# Patient Record
Sex: Male | Born: 1955 | Race: White | Hispanic: No | Smoking: Current every day smoker
Health system: Southern US, Community
[De-identification: ages and names within clinical notes are randomized; demographics above are authoritative.]

## PROBLEM LIST (undated history)

## (undated) DIAGNOSIS — I2510 Atherosclerotic heart disease of native coronary artery without angina pectoris: Secondary | ICD-10-CM

## (undated) DIAGNOSIS — K219 Gastro-esophageal reflux disease without esophagitis: Secondary | ICD-10-CM

## (undated) DIAGNOSIS — I10 Essential (primary) hypertension: Secondary | ICD-10-CM

## (undated) DIAGNOSIS — Z720 Tobacco use: Secondary | ICD-10-CM

## (undated) DIAGNOSIS — E785 Hyperlipidemia, unspecified: Secondary | ICD-10-CM

## (undated) DIAGNOSIS — J449 Chronic obstructive pulmonary disease, unspecified: Secondary | ICD-10-CM

## (undated) HISTORY — PX: APPENDECTOMY: SHX54

---

## 2009-12-06 ENCOUNTER — Emergency Department (HOSPITAL_COMMUNITY): Admission: EM | Admit: 2009-12-06 | Discharge: 2009-12-06 | Payer: Self-pay | Admitting: Emergency Medicine

## 2010-07-03 ENCOUNTER — Emergency Department (HOSPITAL_COMMUNITY): Admission: EM | Admit: 2010-07-03 | Discharge: 2010-07-03 | Payer: Self-pay | Admitting: Emergency Medicine

## 2010-10-05 ENCOUNTER — Emergency Department (HOSPITAL_COMMUNITY): Admission: EM | Admit: 2010-10-05 | Discharge: 2010-10-05 | Payer: Self-pay | Admitting: Emergency Medicine

## 2011-03-08 LAB — URINALYSIS, ROUTINE W REFLEX MICROSCOPIC
Bilirubin Urine: NEGATIVE
Glucose, UA: NEGATIVE mg/dL
Hgb urine dipstick: NEGATIVE
Ketones, ur: NEGATIVE mg/dL
Nitrite: NEGATIVE
Protein, ur: NEGATIVE mg/dL
Specific Gravity, Urine: >1.03 — ABNORMAL HIGH (ref 1.005–1.030)
Urobilinogen, UA: 0.2 mg/dL (ref 0.0–1.0)
pH: 5.5 (ref 5.0–8.0)

## 2011-03-08 LAB — GLUCOSE, CAPILLARY: Glucose-Capillary: 120 mg/dL — ABNORMAL HIGH (ref 70–99)

## 2013-04-28 ENCOUNTER — Emergency Department (HOSPITAL_COMMUNITY): Payer: Self-pay

## 2013-04-28 ENCOUNTER — Inpatient Hospital Stay (HOSPITAL_COMMUNITY)
Admission: EM | Admit: 2013-04-28 | Discharge: 2013-05-10 | DRG: 234 | Disposition: A | Discharge status: Home / Self Care | Payer: MEDICAID | Attending: Cardiothoracic Surgery | Admitting: Cardiothoracic Surgery

## 2013-04-28 ENCOUNTER — Encounter (HOSPITAL_COMMUNITY): Payer: Self-pay | Admitting: *Deleted

## 2013-04-28 DIAGNOSIS — J95811 Postprocedural pneumothorax: Secondary | ICD-10-CM | POA: Diagnosis present

## 2013-04-28 DIAGNOSIS — Y832 Surgical operation with anastomosis, bypass or graft as the cause of abnormal reaction of the patient, or of later complication, without mention of misadventure at the time of the procedure: Secondary | ICD-10-CM | POA: Diagnosis present

## 2013-04-28 DIAGNOSIS — I658 Occlusion and stenosis of other precerebral arteries: Secondary | ICD-10-CM | POA: Diagnosis present

## 2013-04-28 DIAGNOSIS — F17200 Nicotine dependence, unspecified, uncomplicated: Secondary | ICD-10-CM | POA: Diagnosis present

## 2013-04-28 DIAGNOSIS — E8770 Fluid overload, unspecified: Secondary | ICD-10-CM

## 2013-04-28 DIAGNOSIS — Z7982 Long term (current) use of aspirin: Secondary | ICD-10-CM

## 2013-04-28 DIAGNOSIS — I6529 Occlusion and stenosis of unspecified carotid artery: Secondary | ICD-10-CM | POA: Diagnosis present

## 2013-04-28 DIAGNOSIS — E8779 Other fluid overload: Secondary | ICD-10-CM | POA: Diagnosis not present

## 2013-04-28 DIAGNOSIS — I10 Essential (primary) hypertension: Secondary | ICD-10-CM | POA: Diagnosis present

## 2013-04-28 DIAGNOSIS — T8182XA Emphysema (subcutaneous) resulting from a procedure, initial encounter: Secondary | ICD-10-CM | POA: Diagnosis not present

## 2013-04-28 DIAGNOSIS — K219 Gastro-esophageal reflux disease without esophagitis: Secondary | ICD-10-CM | POA: Diagnosis present

## 2013-04-28 DIAGNOSIS — R079 Chest pain, unspecified: Secondary | ICD-10-CM

## 2013-04-28 DIAGNOSIS — I739 Peripheral vascular disease, unspecified: Secondary | ICD-10-CM | POA: Diagnosis present

## 2013-04-28 DIAGNOSIS — J449 Chronic obstructive pulmonary disease, unspecified: Secondary | ICD-10-CM | POA: Diagnosis present

## 2013-04-28 DIAGNOSIS — E781 Pure hyperglyceridemia: Secondary | ICD-10-CM | POA: Diagnosis present

## 2013-04-28 DIAGNOSIS — Z79899 Other long term (current) drug therapy: Secondary | ICD-10-CM

## 2013-04-28 DIAGNOSIS — D696 Thrombocytopenia, unspecified: Secondary | ICD-10-CM | POA: Diagnosis not present

## 2013-04-28 DIAGNOSIS — F17210 Nicotine dependence, cigarettes, uncomplicated: Secondary | ICD-10-CM | POA: Diagnosis present

## 2013-04-28 DIAGNOSIS — R0609 Other forms of dyspnea: Secondary | ICD-10-CM | POA: Diagnosis present

## 2013-04-28 DIAGNOSIS — J4489 Other specified chronic obstructive pulmonary disease: Secondary | ICD-10-CM | POA: Diagnosis present

## 2013-04-28 DIAGNOSIS — R0989 Other specified symptoms and signs involving the circulatory and respiratory systems: Secondary | ICD-10-CM | POA: Diagnosis present

## 2013-04-28 DIAGNOSIS — E785 Hyperlipidemia, unspecified: Secondary | ICD-10-CM | POA: Diagnosis present

## 2013-04-28 DIAGNOSIS — R209 Unspecified disturbances of skin sensation: Secondary | ICD-10-CM | POA: Diagnosis present

## 2013-04-28 DIAGNOSIS — I2510 Atherosclerotic heart disease of native coronary artery without angina pectoris: Principal | ICD-10-CM | POA: Diagnosis present

## 2013-04-28 DIAGNOSIS — I200 Unstable angina: Secondary | ICD-10-CM | POA: Diagnosis present

## 2013-04-28 DIAGNOSIS — D62 Acute posthemorrhagic anemia: Secondary | ICD-10-CM | POA: Diagnosis not present

## 2013-04-28 DIAGNOSIS — Z951 Presence of aortocoronary bypass graft: Secondary | ICD-10-CM

## 2013-04-28 HISTORY — DX: Gastro-esophageal reflux disease without esophagitis: K21.9

## 2013-04-28 HISTORY — DX: Tobacco use: Z72.0

## 2013-04-28 LAB — CBC
HCT: 43.1 % (ref 39.0–52.0)
MCH: 31.6 pg (ref 26.0–34.0)
MCV: 87.2 fL (ref 78.0–100.0)
RDW: 12.8 % (ref 11.5–15.5)
WBC: 11.8 10*3/uL — ABNORMAL HIGH (ref 4.0–10.5)

## 2013-04-28 LAB — HEPATIC FUNCTION PANEL
ALT: 12 U/L (ref 0–53)
Albumin: 3.6 g/dL (ref 3.5–5.2)
Alkaline Phosphatase: 97 U/L (ref 39–117)
Total Bilirubin: 0.2 mg/dL — ABNORMAL LOW (ref 0.3–1.2)
Total Protein: 6.8 g/dL (ref 6.0–8.3)

## 2013-04-28 LAB — TROPONIN I: Troponin I: <0.3 ng/mL (ref <0.30)

## 2013-04-28 LAB — COMPREHENSIVE METABOLIC PANEL
Albumin: 4.2 g/dL (ref 3.5–5.2)
BUN: 25 mg/dL — ABNORMAL HIGH (ref 6–23)
Calcium: 9.4 mg/dL (ref 8.4–10.5)
Chloride: 103 mEq/L (ref 96–112)
Creatinine, Ser: 1.1 mg/dL (ref 0.50–1.35)
GFR calc non Af Amer: 73 mL/min — ABNORMAL LOW (ref >90)
Total Bilirubin: 0.3 mg/dL (ref 0.3–1.2)

## 2013-04-28 MED ORDER — METOPROLOL TARTRATE 25 MG PO TABS
25.0000 mg | ORAL_TABLET | Freq: Two times a day (BID) | ORAL | Status: DC
  Administered 2013-04-28 – 2013-05-03 (×11): 25 mg via ORAL
  Filled 2013-04-28 (×13): qty 1

## 2013-04-28 MED ORDER — ONDANSETRON HCL 4 MG PO TABS: 4.0000 mg | ORAL_TABLET | Freq: Four times a day (QID) | ORAL | Status: DC | PRN

## 2013-04-28 MED ORDER — SODIUM CHLORIDE 0.9 % IJ SOLN
3.0000 mL | Freq: Two times a day (BID) | INTRAMUSCULAR | Status: DC
  Administered 2013-04-28 – 2013-05-01 (×4): 3 mL via INTRAVENOUS
  Filled 2013-04-28: qty 3

## 2013-04-28 MED ORDER — ONDANSETRON HCL 4 MG/2ML IJ SOLN
4.0000 mg | Freq: Four times a day (QID) | INTRAMUSCULAR | Status: DC | PRN
  Administered 2013-05-03: 4 mg via INTRAVENOUS
  Filled 2013-04-28: qty 2

## 2013-04-28 MED ORDER — ASPIRIN EC 81 MG PO TBEC
81.0000 mg | DELAYED_RELEASE_TABLET | Freq: Every day | ORAL | Status: DC
  Administered 2013-04-29 – 2013-05-03 (×4): 81 mg via ORAL
  Filled 2013-04-28 (×6): qty 1

## 2013-04-28 MED ORDER — NICOTINE 21 MG/24HR TD PT24
21.0000 mg | MEDICATED_PATCH | Freq: Every day | TRANSDERMAL | Status: DC
  Administered 2013-04-28 – 2013-05-09 (×9): 21 mg via TRANSDERMAL
  Filled 2013-04-28 (×13): qty 1

## 2013-04-28 MED ORDER — ONDANSETRON HCL 4 MG/2ML IJ SOLN: 4.0000 mg | INTRAMUSCULAR | Status: DC | PRN

## 2013-04-28 MED ORDER — ENOXAPARIN SODIUM 40 MG/0.4ML ~~LOC~~ SOLN
40.0000 mg | SUBCUTANEOUS | Status: DC
  Administered 2013-04-28: 40 mg via SUBCUTANEOUS
  Filled 2013-04-28: qty 0.4

## 2013-04-28 MED ORDER — NITROGLYCERIN 0.4 MG SL SUBL
SUBLINGUAL_TABLET | SUBLINGUAL | Status: AC
  Administered 2013-04-28: 0.4 mg via SUBLINGUAL
  Filled 2013-04-28: qty 25

## 2013-04-28 MED ORDER — NITROGLYCERIN 0.4 MG SL SUBL
0.4000 mg | SUBLINGUAL_TABLET | SUBLINGUAL | Status: DC | PRN
  Administered 2013-04-28 (×2): 0.4 mg via SUBLINGUAL

## 2013-04-28 MED ORDER — TRAZODONE HCL 50 MG PO TABS
50.0000 mg | ORAL_TABLET | Freq: Every evening | ORAL | Status: DC | PRN
  Filled 2013-04-28: qty 1

## 2013-04-28 MED ORDER — ACETAMINOPHEN 325 MG PO TABS
650.0000 mg | ORAL_TABLET | ORAL | Status: DC | PRN
  Administered 2013-04-29: 650 mg via ORAL
  Filled 2013-04-28: qty 2

## 2013-04-28 MED ORDER — PANTOPRAZOLE SODIUM 40 MG PO TBEC
40.0000 mg | DELAYED_RELEASE_TABLET | Freq: Every day | ORAL | Status: DC
  Administered 2013-04-29 – 2013-05-03 (×5): 40 mg via ORAL
  Filled 2013-04-28 (×5): qty 1

## 2013-04-28 NOTE — ED Provider Notes (Signed)
History  This chart was scribed for Geoffery Lyons, MD by Shari Heritage, ED Scribe. The patient was seen in room APA11/APA11. Patient's care was started at 1739.   CSN: [id number]  Arrival date & time 04/28/13  1716   First MD Initiated Contact with Patient 04/28/13 1739      Chief Complaint  Patient presents with  . Chest Pain    The history is provided by the patient. No language interpreter was used.    HPI Comments: Mark Carpenter is a 57 y.o. male who presents to the Emergency Department complaining of intermittent, heavy, central chest pain onset 2 weeks ago. Patient states that pain often comes on with exertion. The pain radiates down both arms and through to his back. There is associated tingling in the upper extremities and dyspnea that is worse with exertion. Patient has been taking aspirin with pain episodes. He states that he took 1-325 mg aspirin tablet this morning and has taken several aspirin 81 mg this afternoon. Patient denies history of cardiac disease. He has no history of cardiac catheterization or stents. He is not followed regularly by cardiology. Patient says that he was adopted so knows little of his family medical history. He smokes 2 packs of cigarettes per day.    History reviewed. No pertinent past medical history.  Past Surgical History  Procedure Laterality Date  . Appendectomy      History reviewed. No pertinent family history.  History  Substance Use Topics  . Smoking status: Current Every Day Smoker  . Smokeless tobacco: Not on file  . Alcohol Use: No      Review of Systems A complete 10 system review of systems was obtained and all systems are negative except as noted in the HPI and PMH.   Allergies  Review of patient's allergies indicates no known allergies.  Home Medications  No current outpatient prescriptions on file.  Triage Vitals: BP 173/84  Pulse 97  Temp(Src) 99.9 F (37.7 C) (Oral)  Resp 20  Ht 5\' 11"  (1.803 m)  Wt 190 lb  (86.183 kg)  BMI 26.51 kg/m2  SpO2 99%  Physical Exam  Constitutional: He is oriented to person, place, and time. He appears well-developed and well-nourished.  HENT:  Head: Normocephalic and atraumatic.  Mouth/Throat: Oropharynx is clear and moist.  Eyes: Conjunctivae and EOM are normal. Pupils are equal, round, and reactive to light.  Neck: Normal range of motion. Neck supple.  Cardiovascular: Normal rate, regular rhythm and normal heart sounds.   No murmur heard. Pulmonary/Chest: Effort normal and breath sounds normal. No respiratory distress. He exhibits no tenderness.  No chest wall tenderness.  Abdominal: Soft. Bowel sounds are normal. There is no tenderness.  Musculoskeletal: Normal range of motion.  Neurological: He is alert and oriented to person, place, and time.  Skin: Skin is warm and dry. No rash noted.  Psychiatric: He has a normal mood and affect. His behavior is normal.    ED Course  Procedures (including critical care time) DIAGNOSTIC STUDIES: Oxygen Saturation is 99% on room air, normal by my interpretation.    COORDINATION OF CARE: 5:53 PM- Patient informed of current plan for treatment and evaluation and agrees with plan at this time.     Labs Reviewed - No data to display No results found.   No diagnosis found.   Date: 04/28/2013  Rate: 95  Rhythm: normal sinus rhythm  QRS Axis: normal  Intervals: normal  ST/T Wave abnormalities: nonspecific T wave changes  Conduction Disutrbances:none  Narrative Interpretation:   Old EKG Reviewed: changes noted    MDM  Patient presents with exertional chest discomfort for the past two weeks.  His history is concerning for cardiac disease and the ekg shows borderline T wave flattening in the lateral leads.  I have spoken with Dr. Orvan Falconer who agrees to admit.        I personally performed the services described in this documentation, which was scribed in my presence. The recorded information has been  reviewed and is accurate.    Geoffery Lyons, MD 04/28/13 Ernestina [place]

## 2013-04-28 NOTE — ED Notes (Signed)
Nurse will call back for report.

## 2013-04-28 NOTE — ED Notes (Addendum)
Pt presents with chest pain intermittent x2 weeks. States pain is worse with activity and feels like "an elephant sitting on his chest" states he gets relief when he rests. Pt denies cardiac or any medical history. Upon admission states mild chest pain around 4/10. States some numbness and tingling in both arms

## 2013-04-28 NOTE — H&P (Signed)
[facility] History and Physical  Mark Carpenter  YNW:[id number]  DOB: 11-05-1956   DOA: 04/28/2013   PCP:   No primary provider on file.  Chief Complaint:  Chest pain with exertion getting worse over 2 months  HPI: Mark Carpenter is an 57 y.o. male.    Adopted Middle-aged Caucasian gentleman with no medical, smokes 2 pack of cigarettes per day, does maintenance work in [employer], reports episodic exertional chest pain for the past 2 months. Started getting worse 2 weeks ago when he noted chest pains every day, lasting 2-15 minutes, and resolving completely after about 10 minutes of rest. It feels like an elephant sitting on his chest; and it radiates through to his shoulder blades, and is associated with numbness of both arms.  Note because symptoms seemed to be getting much worse he came to the emergency room today to be evaluated. In the ER EKG was done which showed ST depression in leads V4 to V6, and hospitalist service was called to assess.  Patient denies any associated nausea vomiting dizziness or diaphoresis. Denies any bloody or black stool. In fact denies any other symptoms except occasional wheezing.  Rewiew of Systems:   Maintenance  worker in [place]  All systems negative except as marked bold or noted in the HPI;  Constitutional: malaise, fever and chills. ;  Eyes: eye pain, redness and discharge. ;  ENMT: ear pain, hoarseness, nasal congestion, sinus pressure and sore throat. ;  Cardiovascular palpitations, diaphoresis, dyspnea and peripheral edema. ;  Respiratory: cough, hemoptysis, wheezing and stridor. ;  Gastrointestinal: nausea, vomiting, diarrhea, constipation, abdominal pain, melena, blood in stool, hematemesis, jaundice and rectal bleeding. unusual weight loss..  Genitourinary: frequency, dysuria, incontinence,flank pain and hematuria;  Musculoskeletal: back pain and neck pain. swelling and trauma.;  Skin: . pruritus, rash, abrasions, bruising and skin  lesion.; ulcerations  Neuro: headache, lightheadedness and neck stiffness. weakness, altered level of consciousness , altered mental status, extremity weakness, burning feet, involuntary movement, seizure and syncope.  Psych: anxiety, depression, insomnia, tearfulness, panic attacks, hallucinations, paranoia, suicidal or homicidal ideation    History reviewed. No pertinent past medical history.  Past Surgical History  Procedure Laterality Date  . Appendectomy      Medications:  HOME MEDS: Prior to Admission medications   Medication Sig Start Date End Date Taking? Authorizing Provider  aspirin EC 325 MG tablet Take 325 mg by mouth every morning.   Yes Historical Provider, MD  diphenhydramine-acetaminophen (TYLENOL PM) 25-500 MG TABS Take 2 tablets by mouth at bedtime.   Yes Historical Provider, MD  ibuprofen (ADVIL,MOTRIN) 200 MG tablet Take 1,000 mg by mouth every 6 (six) hours as needed for pain.   Yes Historical Provider, MD  omeprazole (PRILOSEC) 20 MG capsule Take 20 mg by mouth every morning.   Yes Historical Provider, MD     Allergies:  No Known Allergies  Social History:   reports that he has been smoking.  He does not have any smokeless tobacco history on file. He reports that he does not drink alcohol or use illicit drugs.  Family History: Family History  Problem Relation Age of Onset  . Adopted: Yes     Physical Exam: Filed Vitals:   04/28/13 1809 04/28/13 1904 04/28/13 2036 04/28/13 2257  BP: 154/83 140/73 155/83 164/84  Pulse:  80 87 81  Temp:   98.2 F (36.8 C) 98.3 F (36.8 C)  TempSrc:   Oral   Resp: 18 18 20  20  Height:   5\' 11"  (1.803 m)   Weight:   86.183 kg (190 lb)   SpO2: 98% 97% 98% 97%   Blood pressure 164/84, pulse 81, temperature 98.3 F (36.8 C), temperature source Oral, resp. rate 20, height 5\' 11"  (1.803 m), weight 86.183 kg (190 lb), SpO2 97.00%.  GEN:  Pleasant pleasant middle-aged Caucasian gentleman lying bed in no acute distress;  cooperative with exam PSYCH:  alert and oriented x4;  neither anxious or depressed; affect is appropriate. HEENT: Mucous membranes pink and anicteric; PERRLA; EOM intact; no cervical lymphadenopathy nor thyromegaly or carotid bruit; no JVD; Breasts:: Not examined CHEST WALL: No tenderness CHEST: Normal respiration, clear to auscultation bilaterally HEART: Regular rate and rhythm; no murmurs rubs or gallops BACK: No kyphosis no scoliosis; no CVA tenderness ABDOMEN: Obese, soft non-tender; no masses, no organomegaly, normal abdominal bowel sounds; no pannus; no intertriginous candida. Rectal Exam: Not done EXTREMITIES:  age-appropriate arthropathy of the hands and knees; no edema; no ulcerations. Genitalia: not examined PULSES: 2+ and symmetric SKIN: Normal hydration no rash or ulceration CNS: Cranial nerves 2-12 grossly intact no focal lateralizing neurologic deficit   Labs on Admission:  Basic Metabolic Panel:  Recent Labs Lab 04/28/13 1742  NA 139  K 3.9  CL 103  CO2 25  GLUCOSE 93  BUN 25*  CREATININE 1.10  CALCIUM 9.4   Liver Function Tests:  Recent Labs Lab 04/28/13 1742  AST 17  ALT 14  ALKPHOS 113  BILITOT 0.3  PROT 7.5  ALBUMIN 4.2   No results found for this basename: LIPASE, AMYLASE,  in the last 168 hours No results found for this basename: AMMONIA,  in the last 168 hours CBC:  Recent Labs Lab 04/28/13 1742  WBC 11.8*  HGB 15.6  HCT 43.1  MCV 87.2  PLT 219   Cardiac Enzymes:  Recent Labs Lab 04/28/13 1742  TROPONINI <0.30   BNP: No components found with this basename: POCBNP,  D-dimer: No components found with this basename: D-DIMER,  CBG: No results found for this basename: GLUCAP,  in the last 168 hours  Radiological Exams on Admission: Dg Chest Portable 1 View  04/28/2013  *RADIOLOGY REPORT*  Clinical Data: Chest pain, smoker  PORTABLE CHEST - 1 VIEW  Comparison: Chest radiograph 12/06/2009  Findings: Normal mediastinum and cardiac  silhouette.  Normal pulmonary  vasculature.  No evidence of effusion, infiltrate, or pneumothorax.  No acute bony abnormality.  IMPRESSION: No acute cardiopulmonary process.   Original Report Authenticated By: Genevive Bi, M.D.     EKG: Independently reviewed. Normal sinus rhythm, occasional ectopics; ST depression in V3 to V6; no ST depression noted in limb lead 1.   Assessment/Plan Present on Admission:  . Unstable angina . Cigarette smoker   PLAN: Since chest pain seems to be more accelerated, we'll admit for observation to cycle cardiac enzymes and monitor on telemetry. Consult cardiology for stress echo or cardiac cath as in or outpatient  Other plans as per orders.  Code Status: Full code Family Communication: Plans discussed with patient Disposition Plan: Home in a day or 2 Depending on the overnight course    Seaira Byus Nocturnist [facility] Pager [phone]   04/28/2013, 11:07 PM

## 2013-04-28 NOTE — ED Notes (Signed)
Chest pain intermittent, esp on exertion. With pain down both arms and tingling, Back pain.  cough

## 2013-04-28 NOTE — ED Notes (Signed)
Pt states relief of pain

## 2013-04-28 NOTE — ED Notes (Signed)
Chest pain 3/10, states some relief after first nitro, second given.

## 2013-04-29 ENCOUNTER — Encounter (HOSPITAL_COMMUNITY): Payer: Self-pay | Admitting: Internal Medicine

## 2013-04-29 ENCOUNTER — Encounter (HOSPITAL_COMMUNITY): Payer: Self-pay | Admitting: Pharmacy Technician

## 2013-04-29 DIAGNOSIS — F17200 Nicotine dependence, unspecified, uncomplicated: Secondary | ICD-10-CM

## 2013-04-29 DIAGNOSIS — E781 Pure hyperglyceridemia: Secondary | ICD-10-CM | POA: Diagnosis present

## 2013-04-29 DIAGNOSIS — K219 Gastro-esophageal reflux disease without esophagitis: Secondary | ICD-10-CM | POA: Diagnosis present

## 2013-04-29 DIAGNOSIS — R0989 Other specified symptoms and signs involving the circulatory and respiratory systems: Secondary | ICD-10-CM

## 2013-04-29 DIAGNOSIS — I200 Unstable angina: Secondary | ICD-10-CM

## 2013-04-29 DIAGNOSIS — I10 Essential (primary) hypertension: Secondary | ICD-10-CM

## 2013-04-29 DIAGNOSIS — E785 Hyperlipidemia, unspecified: Secondary | ICD-10-CM | POA: Diagnosis present

## 2013-04-29 LAB — LIPID PANEL
Cholesterol: 192 mg/dL (ref 0–200)
HDL: 32 mg/dL — ABNORMAL LOW (ref >39)
Total CHOL/HDL Ratio: 6 RATIO

## 2013-04-29 LAB — RAPID URINE DRUG SCREEN, HOSP PERFORMED
Amphetamines: NOT DETECTED
Benzodiazepines: NOT DETECTED
Cocaine: NOT DETECTED
Opiates: NOT DETECTED
Tetrahydrocannabinol: NOT DETECTED

## 2013-04-29 LAB — CBC
HCT: 41.5 % (ref 39.0–52.0)
Hemoglobin: 14.7 g/dL (ref 13.0–17.0)
Hemoglobin: 14.5 g/dL (ref 13.0–17.0)
MCH: 31.1 pg (ref 26.0–34.0)
MCV: 87.7 fL (ref 78.0–100.0)
MCV: 84.8 fL (ref 78.0–100.0)
Platelets: 205 10*3/uL (ref 150–400)
Platelets: 202 10*3/uL (ref 150–400)
RBC: 4.73 MIL/uL (ref 4.22–5.81)
RBC: 4.68 MIL/uL (ref 4.22–5.81)
WBC: 10 10*3/uL (ref 4.0–10.5)
WBC: 13.6 10*3/uL — ABNORMAL HIGH (ref 4.0–10.5)

## 2013-04-29 LAB — BASIC METABOLIC PANEL
CO2: 27 mEq/L (ref 19–32)
Chloride: 103 mEq/L (ref 96–112)
Glucose, Bld: 104 mg/dL — ABNORMAL HIGH (ref 70–99)
Potassium: 4.1 mEq/L (ref 3.5–5.1)
Sodium: 139 mEq/L (ref 135–145)

## 2013-04-29 LAB — URINALYSIS, ROUTINE W REFLEX MICROSCOPIC
Glucose, UA: NEGATIVE mg/dL
Hgb urine dipstick: NEGATIVE
Leukocytes, UA: NEGATIVE
Protein, ur: NEGATIVE mg/dL
pH: 5.5 (ref 5.0–8.0)

## 2013-04-29 LAB — TSH: TSH: 3.465 u[IU]/mL (ref 0.350–4.500)

## 2013-04-29 LAB — TROPONIN I: Troponin I: <0.3 ng/mL (ref <0.30)

## 2013-04-29 MED ORDER — HEPARIN BOLUS VIA INFUSION
4000.0000 [IU] | Freq: Once | INTRAVENOUS | Status: AC
  Administered 2013-04-29: 4000 [IU] via INTRAVENOUS
  Filled 2013-04-29: qty 4000

## 2013-04-29 MED ORDER — ZOLPIDEM TARTRATE 5 MG PO TABS
5.0000 mg | ORAL_TABLET | Freq: Every evening | ORAL | Status: DC | PRN
  Administered 2013-04-29 – 2013-05-02 (×4): 5 mg via ORAL
  Filled 2013-04-29 (×4): qty 1

## 2013-04-29 MED ORDER — HEPARIN (PORCINE) IN NACL 100-0.45 UNIT/ML-% IJ SOLN
1000.0000 [IU]/h | INTRAMUSCULAR | Status: DC
  Administered 2013-04-29: 1000 [IU]/h via INTRAVENOUS
  Filled 2013-04-29: qty 250

## 2013-04-29 MED ORDER — NITROGLYCERIN 2 % TD OINT
0.5000 [in_us] | TOPICAL_OINTMENT | Freq: Four times a day (QID) | TRANSDERMAL | Status: DC
  Administered 2013-04-29: 0.5 [in_us] via TOPICAL
  Filled 2013-04-29 (×2): qty 30
  Filled 2013-04-29: qty 1

## 2013-04-29 MED ORDER — HEPARIN SODIUM (PORCINE) 5000 UNIT/ML IJ SOLN
5000.0000 [IU] | Freq: Three times a day (TID) | INTRAMUSCULAR | Status: DC
  Filled 2013-04-29 (×5): qty 1

## 2013-04-29 MED ORDER — METOPROLOL TARTRATE 1 MG/ML IV SOLN
INTRAVENOUS | Status: AC
  Administered 2013-04-29: 5 mg
  Filled 2013-04-29: qty 5

## 2013-04-29 MED ORDER — ASPIRIN 81 MG PO CHEW
324.0000 mg | CHEWABLE_TABLET | ORAL | Status: AC
  Administered 2013-04-30: 324 mg via ORAL
  Filled 2013-04-29: qty 4

## 2013-04-29 MED ORDER — ATORVASTATIN CALCIUM 40 MG PO TABS
40.0000 mg | ORAL_TABLET | Freq: Every day | ORAL | Status: DC
  Administered 2013-04-29 – 2013-05-09 (×10): 40 mg via ORAL
  Filled 2013-04-29 (×12): qty 1

## 2013-04-29 MED ORDER — AMLODIPINE BESYLATE 2.5 MG PO TABS
2.5000 mg | ORAL_TABLET | Freq: Every day | ORAL | Status: DC
  Administered 2013-04-29 – 2013-05-03 (×5): 2.5 mg via ORAL
  Filled 2013-04-29 (×5): qty 1

## 2013-04-29 MED ORDER — SODIUM CHLORIDE 0.9 % IV SOLN
INTRAVENOUS | Status: DC
  Administered 2013-04-29: 10 mL/h via INTRAVENOUS
  Administered 2013-05-03: 13:00:00 via INTRAVENOUS

## 2013-04-29 MED ORDER — SODIUM CHLORIDE 0.9 % IJ SOLN: 3.0000 mL | INTRAMUSCULAR | Status: DC | PRN

## 2013-04-29 MED ORDER — DIAZEPAM 5 MG PO TABS
2.5000 mg | ORAL_TABLET | Freq: Once | ORAL | Status: AC
  Administered 2013-04-29: 2.5 mg via ORAL
  Filled 2013-04-29: qty 1

## 2013-04-29 MED ORDER — SODIUM CHLORIDE 0.9 % IJ SOLN
3.0000 mL | Freq: Two times a day (BID) | INTRAMUSCULAR | Status: DC
  Administered 2013-04-29 – 2013-04-30 (×2): 3 mL via INTRAVENOUS

## 2013-04-29 MED ORDER — SODIUM CHLORIDE 0.9 % IV SOLN: 1.0000 mL/kg/h | INTRAVENOUS | Status: DC

## 2013-04-29 MED ORDER — SODIUM CHLORIDE 0.9 % IV SOLN: 250.0000 mL | INTRAVENOUS | Status: DC | PRN

## 2013-04-29 MED ORDER — DIAZEPAM 5 MG PO TABS: 2.5000 mg | ORAL_TABLET | ORAL | Status: AC

## 2013-04-29 NOTE — Progress Notes (Signed)
ANTICOAGULATION CONSULT NOTE - Initial Consult  Pharmacy Consult for Heparin Indication: chest pain/ACS  No Known Allergies  Patient Measurements: Height: 5\' 11"  (180.3 cm) Weight: 189 lb 13.1 oz (86.1 kg) IBW/kg (Calculated) : 75.3 Heparin Dosing Weight: 79Kg  Vital Signs: Temp: 97.4 F (36.3 C) (05/07 0629) Temp src: Oral (05/07 0400) BP: 153/77 mmHg (05/07 1004) Pulse Rate: 90 (05/07 1004)  Labs:  Recent Labs  04/28/13 1742 04/28/13 2313 04/29/13 0544 04/29/13 1043  HGB 15.6  --  14.7  --   HCT 43.1  --  41.5  --   PLT 219  --  205  --   CREATININE 1.10  --  1.07  --   TROPONINI <0.30 <0.30 <0.30 <0.30   Estimated Creatinine Clearance: 81.1 ml/min (by C-G formula based on Cr of 1.07).  Medical History: Past Medical History  Diagnosis Date  . GERD (gastroesophageal reflux disease)    Medications:  Infusions:  . heparin     Assessment: 57yo male with h/o unstable angina.  Pt is reportedly going to [facility] for [facility] cath.  To begin IV heparin prior to transfer. Goal of Therapy:  Heparin level 0.3-0.7 units/ml Monitor platelets by anticoagulation protocol: Yes   Plan:  Give 4000 units bolus x 1 Start heparin infusion at 1000 units/hr Check anti-Xa level in 6-8 hours and daily while on heparin Continue to monitor H&H and platelets  Margo Aye, Cordelle Dahmen A 04/29/2013,11:55 AM

## 2013-04-29 NOTE — Progress Notes (Addendum)
[facility] PROGRESS NOTE  BURLIN MCNAIR ZOX:[id number] DOB: 11/21/1956 DOA: 04/28/2013 PCP: No primary provider on file.  Assessment/Plan: . Unstable angina: troponins neg to date. Repeat EKG pending. Reports only mild pain briefly this am. Also reports increased pain to scapula area with flexion/extensoin of neck. Tele without events. Will await cardiology recommendations.  . Cigarette smoker: smoking cessation counseling offered  GERD: pt reports frequent acid reflux particularly at night. States he takes prilosec for last 2 weeks with little improvement. Improves when sleeping elevated. Continue PPI  Elevated triglyceride    Code Status: full Family Communication: wife at bedside Disposition Plan: home maybe this afternoon    Consultants:  Cardiology  Procedures:  none  Antibiotics:  none  HPI/Subjective: Alert denies pain/discomfort. NAD  Objective: Filed Vitals:   04/28/13 2257 04/29/13 0400 04/29/13 0500 04/29/13 0629  BP: 164/84 146/76  165/91  Pulse: 81 73  73  Temp: 98.3 F (36.8 C) 98 F (36.7 C)  97.4 F (36.3 C)  TempSrc:  Oral    Resp: 20 20  20   Height:      Weight:   86.1 kg (189 lb 13.1 oz)   SpO2: 97% 98%  96%   No intake or output data in the 24 hours ending 04/29/13 0939 Filed Weights   04/28/13 1731 04/28/13 2036 04/29/13 0500  Weight: 86.183 kg (190 lb) 86.183 kg (190 lb) 86.1 kg (189 lb 13.1 oz)    Exam:   General:  Well nourished NAD  Cardiovascular: RRR, No MGR no LE edema PPP  Respiratory: normal effort BS slightly distant bases but clear. No wheeze, no rhonchi  Abdomen: soft +BS non-tender to palpation  Musculoskeletal: no clubbing no cyanosis. UE strength 5/5. Bilateral UE sensation intact.   Data Reviewed: Basic Metabolic Panel:  Recent Labs Lab 04/28/13 1742 04/29/13 0544  NA 139 139  K 3.9 4.1  CL 103 103  CO2 25 27  GLUCOSE 93 104*  BUN 25* 25*  CREATININE 1.10 1.07  CALCIUM 9.4 9.1   Liver  Function Tests:  Recent Labs Lab 04/28/13 1742 04/28/13 2313  AST 17 14  ALT 14 12  ALKPHOS 113 97  BILITOT 0.3 0.2*  PROT 7.5 6.8  ALBUMIN 4.2 3.6   No results found for this basename: LIPASE, AMYLASE,  in the last 168 hours No results found for this basename: AMMONIA,  in the last 168 hours CBC:  Recent Labs Lab 04/28/13 1742 04/29/13 0544  WBC 11.8* 10.0  HGB 15.6 14.7  HCT 43.1 41.5  MCV 87.2 87.7  PLT 219 205   Cardiac Enzymes:  Recent Labs Lab 04/28/13 1742 04/28/13 2313 04/29/13 0544  TROPONINI <0.30 <0.30 <0.30   BNP (last 3 results) No results found for this basename: PROBNP,  in the last 8760 hours CBG: No results found for this basename: GLUCAP,  in the last 168 hours  No results found for this or any previous visit (from the past 240 hour(s)).   Studies: Dg Chest Portable 1 View  04/28/2013  *RADIOLOGY REPORT*  Clinical Data: Chest pain, smoker  PORTABLE CHEST - 1 VIEW  Comparison: Chest radiograph 12/06/2009  Findings: Normal mediastinum and cardiac silhouette.  Normal pulmonary  vasculature.  No evidence of effusion, infiltrate, or pneumothorax.  No acute bony abnormality.  IMPRESSION: No acute cardiopulmonary process.   Original Report Authenticated By: Genevive Bi, M.D.     Scheduled Meds: . aspirin EC  81 mg Oral Daily  . enoxaparin (LOVENOX) injection  40 mg Subcutaneous Q24H  . metoprolol tartrate  25 mg Oral BID  . nicotine  21 mg Transdermal Daily  . pantoprazole  40 mg Oral Daily  . sodium chloride  3 mL Intravenous Q12H   Continuous Infusions:   Active Problems:   Unstable angina   Cigarette smoker   GERD (gastroesophageal reflux disease)    Time spent: 30 minutes    [facility] [place] M  [facility]  If 7PM-7AM, please contact night-coverage at [email or web address], password [facility] 04/29/2013, 9:39 AM  LOS: 1 day   Attending: Patient seen and reviewed. This man clearly gives a history of unstable angina. Risk factors include  smoking but not much else. He is at risk of acute myocardial infarction. He needs cardiac catheterization today. Start intravenous heparin, nitro paste, he is already on beta blocker. I have called cardiology and they have accepted him in transfer to [facility] under the care of Dr. Elease Hashimoto.

## 2013-04-29 NOTE — Progress Notes (Signed)
Report called to GIGI at [facility] on [address]. [facility] here to transport for 2924.

## 2013-04-29 NOTE — Progress Notes (Signed)
UR chart review completed.  

## 2013-04-29 NOTE — Progress Notes (Signed)
Soft murmur ( very distant heart tones) upper chest and Lt carotid areas reported to Lucile Crater ,[place]. B/P and meds reviewed . Orders received.

## 2013-04-29 NOTE — Care Management Note (Signed)
    Page 1 of 1   05/06/2013     3:05:54 PM   CARE MANAGEMENT NOTE 05/06/2013  Patient:  Mark Carpenter, Mark Carpenter   Account Number:  [id number]  Date Initiated:  04/29/2013  Documentation initiated by:  Sharrie Rothman  Subjective/Objective Assessment:   Pt admitted from home with CP. Pt lives with his wife and will return home at discharge. Pt is independent with ADL's.     Action/Plan:   Pt has no insurance but he is employed. He stated that he just cannot afford it. Pt is being transfered to [facility] for cath. Financial counselor is aware of pts self pay status.   Anticipated DC Date:  04/30/2013   Anticipated DC Plan:  HOME/SELF CARE  In-house referral  Financial Counselor      DC Planning Services  CM consult      Choice offered to / List presented to:             Status of service:  Completed, signed off Medicare Important Message given?   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 Patient walking around unit.  R eye swollen shut from [employer].  Verified pateint lives at home with wife who will be with him 24/7 on discharge.  CM will continue to follow for further needs.  04/29/13 1205 Arlyss Queen, RN BSN CM

## 2013-04-29 NOTE — Progress Notes (Signed)
Occupational Therapy Screen  OT orders received. Patient's chart reviewed. Spoke with patient and wife regarding any difficulties or deficits that patient may be experiencing. Patient voices no concerns. Patient is functioning at baseline at independent level. Patient states that he only has been having chest pains and that's it. OT services not needed at this time; will sign off.   Limmie Patricia, OTR/L,CBIS  9:11AM 04/29/13

## 2013-04-29 NOTE — H&P (Addendum)
History and Physical  Patient ID: Mark Carpenter MRN: [id number], SOB: 1956-09-19 57 y.o. Date of Encounter: 04/29/2013, 4:51 PM  Primary Physician: No primary provider on file. Primary Cardiologist: None  Chief Complaint: chest pressure  HPI: 57 y.o. male w/ PMHx significant for GERD, hyperlipidemia, tobacco abuse (2ppd) who presented to [facility] on 04/29/2013 as a transfer from [facility] with complaints of recurrent mid-sternal chest pressure for the past 2 months which has increased in frequency over past 2 weeks.  He states that the pressure lasts from 2-10 minutes, radiates to his back below his shoulder blades, and is associated with bilateral arm numbness.  He reports that he has not had a primary care doctor in over 25 years.  He suffers from frequent "heart burn" and took TUMS daily for several years until 3 weeks ago when he purchased Prilosec over the counter which improved his reflux symptoms.  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  Of note, he states that he sometimes gets chest discomfort after awakening and walking short distances within his home.  His family history is unknown as he is adopted.  Denies shortness of breath, palpitations, diaphoresis, nausea, vomiting, light-headness or presyncope. He is currently without chest discomfort.  No h/o stroke, GI bleed, melena.   EKG revealed NSR with ST depression in leads V4-V6. changes. Troponins were not elevated. CXR was without acute cardiopulmonary abnormalities.   He was transferred to [facility] for further evaluation of unstable angina and possible cardiac catherterization.   Past Medical History  Diagnosis Date  . GERD (gastroesophageal reflux disease)      Surgical History:  Past Surgical History  Procedure Laterality Date  . Appendectomy       Home Meds: Prior to Admission  medications   Medication Sig Start Date End Date Taking? Authorizing Provider  aspirin EC 325 MG tablet Take 325 mg by mouth every morning.   Yes Historical Provider, MD  diphenhydramine-acetaminophen (TYLENOL PM) 25-500 MG TABS Take 2 tablets by mouth at bedtime.   Yes Historical Provider, MD  ibuprofen (ADVIL,MOTRIN) 200 MG tablet Take 1,000 mg by mouth every 6 (six) hours as needed for pain.   Yes Historical Provider, MD  omeprazole (PRILOSEC) 20 MG capsule Take 20 mg by mouth every morning.   Yes Historical Provider, MD    Allergies: No Known Allergies  History   Social History  . Marital Status: Married    Spouse Name: N/A    Number of Children: N/A  . Years of Education: N/A   Occupational History  . Not on file.   Social History Main Topics  . Smoking status: Current Every Day Smoker -- 1.50 packs/day for 35 years  . Smokeless tobacco: Not on file  . Alcohol Use: No  . Drug Use: No  . Sexually Active: Not on file   Other Topics Concern  . Not on file   Social History Narrative  . No narrative on file     Family History  Problem Relation Age of Onset  . Adopted: Yes    Review of Systems: General: negative for chills, fever, night sweats or weight changes.  ENT: negative for rhinorrhea or epistaxis Cardiovascular: endorses chest pressure, intermittent claudication, negative for shortness of breath, dyspnea on exertion, edema, orthopnea, palpitations, or paroxysmal nocturnal dyspnea Dermatological: negative for rash Respiratory: negative for  cough or wheezing GI: endorses heartburn negative for nausea, vomiting, diarrhea, bright red blood per rectum, melena, or hematemesis GU: no hematuria, urgency, or frequency Neurologic: negative for visual changes, syncope, headache, or dizziness Heme: no easy bruising or bleeding Endo: negative for excessive thirst, thyroid disorder, or flushing Musculoskeletal: negative for joint pain or swelling, negative for  myalgias All other systems reviewed and are otherwise negative except as noted above.  Physical Exam: Blood pressure 173/86, pulse 76, temperature 98.3 F (36.8 C), temperature source Oral, resp. rate 20, height 5\' 11"  (1.803 m), weight 183 lb 3.2 oz (83.1 kg), SpO2 99.00%. General: Well developed, well nourished, White male, alert and oriented, in no acute distress. HEENT: Normocephalic, atraumatic, sclera non-icteric  Neck: Supple. Carotids 2+, +carotid bruits bilaterally.  Lungs: Occasional wheezes on left upper lung field otherwise clear to auscultation. Breathing is unlabored. Heart: RRR with normal S1 and S2. No murmurs, rubs, or gallops appreciated. Abdomen: Soft, non-tender, non-distended with normoactive bowel sounds. No hepatomegaly. No rebound/guarding. No obvious abdominal masses. Back: No CVA tenderness Msk:  Strength and tone appear normal for age. Extremities: No clubbing, cyanosis, or edema.  Distal pedal pulses are 2+ and equal bilaterally. Femoral bruits bilaterally Neuro: CNII-XII intact, moves all extremities spontaneously. Psych:  Responds to questions appropriately with a normal affect.   Labs:   Lab Results  Component Value Date   WBC 10.0 04/29/2013   HGB 14.7 04/29/2013   HCT 41.5 04/29/2013   MCV 87.7 04/29/2013   PLT 205 04/29/2013     Recent Labs Lab 04/28/13 2313 04/29/13 0544  NA  --  139  K  --  4.1  CL  --  103  CO2  --  27  BUN  --  25*  CREATININE  --  1.07  CALCIUM  --  9.1  PROT 6.8  --   BILITOT 0.2*  --   ALKPHOS 97  --   ALT 12  --   AST 14  --   GLUCOSE  --  104*    Recent Labs  04/28/13 1742 04/28/13 2313 04/29/13 0544 04/29/13 1043  TROPONINI <0.30 <0.30 <0.30 <0.30   Lab Results  Component Value Date   CHOL 192 04/29/2013   HDL 32* 04/29/2013   LDLCALC 113* 04/29/2013   TRIG 234* 04/29/2013   No results found for this basename: DDIMER    Radiology/Studies:  Dg Chest Portable 1 View  04/28/2013  *RADIOLOGY REPORT*  Clinical  Data: Chest pain, smoker  PORTABLE CHEST - 1 VIEW  Comparison: Chest radiograph 12/06/2009  Findings: Normal mediastinum and cardiac silhouette.  Normal pulmonary  vasculature.  No evidence of effusion, infiltrate, or pneumothorax.  No acute bony abnormality.  IMPRESSION: No acute cardiopulmonary process.   Original Report Authenticated By: Genevive Bi, M.D.      ASSESSMENT AND PLAN:  1. Unstable Angina: currently w/o chest pain or pressure, cont med management, check 2D ECHO, cardiac catheterization   Recent Labs Lab 04/28/13 1742 04/28/13 2313 04/29/13 0544 04/29/13 1043  TROPONINI <0.30 <0.30 <0.30 <0.30   2: Hypertension: new diagnosis this admission 3. Hyperlipidemia: cont statin 4. GERD: cont PPI 5. Tobacco abuse: nicotine patch 6. PAD with carotid and femoral bruits   DISCUSSION: Mark Carpenter presents with unstable angina with risk factors of hypertension, hyperlipidemia, and current smoker. TIMI score risk is 8% for new MI/ severe ischemia requiring revascularization.  His cardiac markers have been negative x3 and he is currently pain free.  Will check  2D ECHO to assess LV function. Schedule catheterization.  Signed, Kristie Cowman, MD Internal Medicine Teaching Service PGY2 04/29/2013, 4:51 PM  Patient seen and examined with Dr. Bosie Clos. We discussed all aspects of the encounter. I agree with the assessment and plan as stated above.   History very concerning for [place]. Agree with ASA, heparin, b-block, statin and NTG. Will schedule cath in am. Reviewed risks in detail.   On exam also has evidence of diffuse vascular disease. Would preform abdominal aortography tomorrow during cath to r/o AAA. Will also need carotid u/s and ABIs.   Reinforced need for smoking cessation.  With regard to HTN will give IV lopressor now and start amlodipine. Can use hydralazine or IV NTG for breakthrough.  Switch prilosec to protonix to minimize interaction with DAPT.  Truman Hayward 5:36 PM

## 2013-04-30 ENCOUNTER — Other Ambulatory Visit: Payer: Self-pay | Admitting: *Deleted

## 2013-04-30 ENCOUNTER — Encounter (HOSPITAL_COMMUNITY)
Admission: EM | Disposition: A | Discharge status: Home / Self Care | Payer: Self-pay | Source: Home / Self Care | Attending: Cardiothoracic Surgery

## 2013-04-30 ENCOUNTER — Ambulatory Visit (HOSPITAL_COMMUNITY): Admission: RE | Admit: 2013-04-30 | Payer: Self-pay | Source: Ambulatory Visit | Admitting: Cardiology

## 2013-04-30 ENCOUNTER — Other Ambulatory Visit: Payer: Self-pay

## 2013-04-30 ENCOUNTER — Encounter (HOSPITAL_COMMUNITY): Payer: Self-pay

## 2013-04-30 DIAGNOSIS — I2510 Atherosclerotic heart disease of native coronary artery without angina pectoris: Secondary | ICD-10-CM

## 2013-04-30 DIAGNOSIS — I200 Unstable angina: Secondary | ICD-10-CM

## 2013-04-30 DIAGNOSIS — Z01810 Encounter for preprocedural cardiovascular examination: Secondary | ICD-10-CM

## 2013-04-30 HISTORY — PX: LEFT HEART CATHETERIZATION WITH CORONARY ANGIOGRAM: SHX5451

## 2013-04-30 HISTORY — PX: INTRAVASCULAR ULTRASOUND: SHX5452

## 2013-04-30 LAB — MAGNESIUM: Magnesium: 2.2 mg/dL (ref 1.5–2.5)

## 2013-04-30 LAB — CBC
HCT: 39 % (ref 39.0–52.0)
Hemoglobin: 14.3 g/dL (ref 13.0–17.0)
MCH: 31.4 pg (ref 26.0–34.0)
MCHC: 36.7 g/dL — ABNORMAL HIGH (ref 30.0–36.0)
MCV: 85.7 fL (ref 78.0–100.0)
Platelets: 188 10*3/uL (ref 150–400)
RBC: 4.55 MIL/uL (ref 4.22–5.81)
RDW: 12.8 % (ref 11.5–15.5)
WBC: 12.6 10*3/uL — ABNORMAL HIGH (ref 4.0–10.5)

## 2013-04-30 LAB — POCT ACTIVATED CLOTTING TIME: Activated Clotting Time: 290 seconds

## 2013-04-30 LAB — BASIC METABOLIC PANEL
Chloride: 102 mEq/L (ref 96–112)
GFR calc non Af Amer: 74 mL/min — ABNORMAL LOW (ref >90)
Glucose, Bld: 109 mg/dL — ABNORMAL HIGH (ref 70–99)
Potassium: 3.9 mEq/L (ref 3.5–5.1)
Sodium: 138 mEq/L (ref 135–145)

## 2013-04-30 LAB — CREATININE, SERUM
Creatinine, Ser: 1.02 mg/dL (ref 0.50–1.35)
GFR calc Af Amer: >90 mL/min (ref >90)

## 2013-04-30 SURGERY — CORONARY ANGIOGRAM
Anesthesia: Moderate Sedation | Laterality: Left

## 2013-04-30 SURGERY — LEFT HEART CATHETERIZATION WITH CORONARY ANGIOGRAM
Anesthesia: LOCAL

## 2013-04-30 MED ORDER — NITROGLYCERIN 1 MG/10 ML FOR IR/CATH LAB
INTRA_ARTERIAL | Status: AC
  Filled 2013-04-30: qty 10

## 2013-04-30 MED ORDER — SODIUM CHLORIDE 0.9 % IJ SOLN
3.0000 mL | Freq: Two times a day (BID) | INTRAMUSCULAR | Status: DC
  Administered 2013-04-30 – 2013-05-03 (×7): 3 mL via INTRAVENOUS

## 2013-04-30 MED ORDER — SODIUM CHLORIDE 0.9 % IJ SOLN: 3.0000 mL | INTRAMUSCULAR | Status: DC | PRN

## 2013-04-30 MED ORDER — MIDAZOLAM HCL 2 MG/2ML IJ SOLN
INTRAMUSCULAR | Status: AC
  Filled 2013-04-30: qty 2

## 2013-04-30 MED ORDER — SODIUM CHLORIDE 0.9 % IV SOLN: 1.0000 mL/kg/h | INTRAVENOUS | Status: AC

## 2013-04-30 MED ORDER — HEPARIN SODIUM (PORCINE) 1000 UNIT/ML IJ SOLN
INTRAMUSCULAR | Status: AC
  Filled 2013-04-30: qty 1

## 2013-04-30 MED ORDER — LIDOCAINE HCL (PF) 1 % IJ SOLN
INTRAMUSCULAR | Status: AC
  Filled 2013-04-30: qty 30

## 2013-04-30 MED ORDER — HEPARIN (PORCINE) IN NACL 100-0.45 UNIT/ML-% IJ SOLN
1600.0000 [IU]/h | INTRAMUSCULAR | Status: DC
  Administered 2013-04-30: 1250 [IU]/h via INTRAVENOUS
  Administered 2013-05-02 – 2013-05-03 (×3): 1600 [IU]/h via INTRAVENOUS
  Filled 2013-04-30 (×9): qty 250

## 2013-04-30 MED ORDER — SODIUM CHLORIDE 0.9 % IV SOLN: 250.0000 mL | INTRAVENOUS | Status: DC | PRN

## 2013-04-30 MED ORDER — FENTANYL CITRATE 0.05 MG/ML IJ SOLN
INTRAMUSCULAR | Status: AC
  Filled 2013-04-30: qty 2

## 2013-04-30 MED ORDER — HEPARIN (PORCINE) IN NACL 2-0.9 UNIT/ML-% IJ SOLN
INTRAMUSCULAR | Status: AC
  Filled 2013-04-30: qty 1000

## 2013-04-30 MED ORDER — VERAPAMIL HCL 2.5 MG/ML IV SOLN
INTRAVENOUS | Status: AC
  Filled 2013-04-30: qty 2

## 2013-04-30 NOTE — CV Procedure (Signed)
   Cardiac Catheterization Procedure Note  Name: Mark Carpenter MRN: [id number] DOB: 18-May-1956  Procedure: Left Heart Cath, Selective Coronary Angiography, LV angiography  Indication: Unstable angina   Procedural Details: Allen's test was positive on the right wrist.  The right wrist was prepped, draped, and anesthetized with 1% lidocaine. Using the modified Seldinger technique, a 5 French sheath was introduced into the right radial artery. 3 mg of verapamil was administered through the sheath, weight-based unfractionated heparin was administered intravenously. Standard Judkins catheters were used for selective coronary angiography and left ventriculography. Catheter exchanges were performed over an exchange length guidewire. There were no immediate procedural complications. A TR band was used for radial hemostasis at the completion of the procedure.  The patient was transferred to the post catheterization recovery area for further monitoring.  Procedural Findings: Hemodynamics: AO 146/82 LV 143/16  Coronary angiography: Coronary dominance: right  Left mainstem: At least 60% distal left main stenosis.   Left anterior descending (LAD): 20-30% proximal stenosis.  Otherwise luminal irregularities.   Left circumflex (LCx): 60-70% ostial LCx stenosis.  50% mid LCx stenosis.  There is a high obtuse marginal that branches and then is totally occluded.  There are faint left to left collaterals filling the distal vessel.   Right coronary artery (RCA): 50% mid-vessel stenosis, 70% distal vessel stenosis.  70% mid PDA stenosis.  There is a moderate acute marginal with 90% proximal stenosis.   Left ventriculography: Left ventricular systolic function is normal, LVEF is estimated at 55-60%.  There appears to be some hypokinesis in the mid anterolateral wall.   Final Conclusions:  Culprit lesion is likely the totally occluded proximal high OM.  By symptoms, this probably happened about a week ago.   Cardiac enzymes were negative.  There is a significant distal LM stenosis extending into the ostial LCx.  The distal LM appears at least 60%, may be worse.  The ostial LCx I would estimate at 60-70%.    Recommendations: IVUS the distal left main today to decide on significance of this lesion.  If it appears significant, CABG is likely the best choice.  If not, medical management (chronic total occlusion of high OM).   [name] 04/30/2013, 8:29 AM

## 2013-04-30 NOTE — Progress Notes (Signed)
ANTICOAGULATION CONSULT NOTE   Pharmacy Consult for Heparin Indication: chest pain/ACS, s/p cath  No Known Allergies  Labs:  Recent Labs  04/28/13 2313 04/29/13 0544 04/29/13 1043 04/29/13 2257 04/30/13 0440  HGB  --  14.7  --  14.5 14.3  HCT  --  41.5  --  39.7 39.0  PLT  --  205  --  202 188  LABPROT  --   --   --  13.4  --   INR  --   --   --  1.03  --   CREATININE  --  1.07  --  1.02 1.08  TROPONINI <0.30 <0.30 <0.30  --   --    Estimated Creatinine Clearance: 80.4 ml/min (by C-G formula based on Cr of 1.08).  Medical History: Past Medical History  Diagnosis Date  . GERD (gastroesophageal reflux disease)    Medications:  Infusions:  . sodium chloride 10 mL/hr at 04/30/13 0600  . sodium chloride 1 mL/kg/hr (04/30/13 0600)  . [DISCONTINUED] heparin Stopped (04/30/13 0645)   Assessment: 57yo male transferred from [facility] hospital with CP.  S/p cardiac cath this AM, needs CABG.  To start heparin 8 hours after sheath removal  Goal of Therapy:  Heparin level 0.3-0.7 units/ml Monitor platelets by anticoagulation protocol: Yes   Plan:  1) Heparin drip at 1250 units / hr starting at 5 pm 2) Daily heparin level, CBC  Thank you. Okey Regal, PharmD [phone]  04/30/2013,9:16 AM

## 2013-04-30 NOTE — Progress Notes (Signed)
[id number] Received preop consult. Discussed with pt the importance of increasing activity after surgery. Discussed sternal precautions and showed pt how he will need to get up and down without using arms. Gave OHS booklet and encouraged pt to watch preop video with family. Discussed smoking cessation. Pt states he and his wife are going to quit. Pt had questions about surgery that were appropriate. He states he will be very motivated to walk after surgery. Pt has concern about money and not working so he will need to see financial counselor and Sports coach after surgery. Will continue to follow pt. Luetta Nutting RNBSN

## 2013-04-30 NOTE — CV Procedure (Signed)
   Cardiac Catheterization Procedure Note  Name: Mark Carpenter MRN: [id number] DOB: 1956/03/15  Procedure: Intravascular ultrasound of the left main  Indication: Unstable angina. This is a 57 year old gentleman who underwent diagnostic cardiac catheterization this morning by Dr. Shirlee Latch. This demonstrated moderate distal left main stenosis with multivessel coronary disease. The patient has an occluded intermediate branch that angiographically appears acute. However, his cardiac markers are all negative. He has a 50-70% distal left main lesion by angiography. He also has diffuse moderate to severe stenosis of the right coronary artery and ostial left circumflex stenosis. Because of the indeterminate nature of his distal left main, we elected to proceed with intravascular ultrasound.   Procedural Details: There is an indwelling 5 French sheath in the right radial artery. This was changed out over a 0.035 inch wire to a 6 [place] sheath. Heparin 4000 units was administered in addition to the initial 4000 units he had received for the diagnostic procedure. A therapeutic ACT was achieved. A JL4 guide catheter was inserted. A wire was passed down the LAD. Intracoronary nitroglycerin was administered. A mechanical pullback was performed from the proximal LAD through the left mainstem. The patient tolerated the procedure well. There were no immediate complications.  A TR band was used for radial hemostasis at the completion of the procedure.  The patient was transferred to the post catheterization recovery area for further monitoring.  Procedural Findings: Intravascular ultrasound:  The proximal LAD has nonobstructive circumferential plaque. The distal left mainstem has focal soft plaque that is ovoid and circumferential.  The mid and proximal left main R. angiographically normal. The mid left main as a reference area of 19 mm. The distal left main in the area of circumferential but eccentric plaque hasn't  area of 6.3 mm. This is a 67% stenosis.  Final Conclusions:  Moderately severe distal left main stenosis with associated eccentric soft plaque.  Recommendations: Considering the patient's ostial left circumflex stenosis, diffuse RCA stenosis, and moderate left main stenosis, I think multivessel coronary bypass surgery is indicated. Will consult cardiac surgery.  Tonny Bollman 04/30/2013, 9:05 AM

## 2013-04-30 NOTE — Interval H&P Note (Signed)
History and Physical Interval Note:  04/30/2013 7:48 AM  Mark Carpenter  has presented today for surgery, with the diagnosis of Chest pain  The various methods of treatment have been discussed with the patient and family. After consideration of risks, benefits and other options for treatment, the patient has consented to  Procedure(s): LEFT HEART CATHETERIZATION WITH CORONARY ANGIOGRAM (N/A) as a surgical intervention .  The patient's history has been reviewed, patient examined, no change in status, stable for surgery.  I have reviewed the patient's chart and labs.  Questions were answered to the patient's satisfaction.     Keturah Yerby [employer]

## 2013-04-30 NOTE — Progress Notes (Signed)
TR band removed, no sign of bleeding, continue to monitor.

## 2013-04-30 NOTE — Progress Notes (Addendum)
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 Pre- occlusive "thump" is noted in distal CCA. Patent ECA. Left = Moderate ECA stenosis. ICA velocities are in the 60-79% range, however this could be compensatory flow due to contralateral occlusion. Antegrade vertebral flow bilaterally.    Upper Extremity Right Left  Brachial Pressures 156 149  Radial Waveforms Tri Tri  Ulnar Waveforms Tri Tri  Palmar Arch (Allen's Test) Did not obtain due to radial cath today Decreases >50% with radial compression, normal with ulnar compression.     Palpable pedal pulses bilaterally.   Farrel Demark, RDMS, RVT 04/30/2013

## 2013-04-30 NOTE — Consult Note (Signed)
[address]          [place] [address]       [id number]       Mark Carpenter [facility] Medical Record #[id number] Date of Birth: 1956/10/10  Referring: No ref. provider found Primary Care: No primary provider on file.  Chief Complaint:    Chief Complaint  Patient presents with  . 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 He presented to the emergency department where his enzymes were negative. He had ST segment changes-depression in the lateral leads. Chest x-ray was no active disease. cardiac catheterization demonstrates 70% left main stenosis with three-vessel CAD and mild LV dysfunction. 2-D echo cardiac shows no significant valvular disease. The patient is in sinus rhythm.  Past medical history significant for heavy smoking 2 packs per day, hypertension and GERD. Prior surgical history positive only for appendectomy at age 34 no history of thoracic trauma or lower extremityr vascular disease. 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 The patient is left-hand dominant without history of stroke or TIA  Current Activity/ Functional Status: Normal activity patient working full-time maintenance   Zubrod Score: At the time of surgery this patient's most appropriate activity status/level should be described as: []  Normal activity, no symptoms [x]  Symptoms, fully ambulatory []  Symptoms, in bed less than or equal to 50% of the time []  Symptoms, in bed greater than 50% of the time  but less than 100% []  Bedridden []  Moribund  Past Medical History  Diagnosis Date  . GERD (gastroesophageal reflux disease)     Past Surgical History  Procedure Laterality Date  . Appendectomy      History  Smoking status  . Current Every Day Smoker -- 1.50 packs/day for 35 years  Smokeless tobacco  . Not on file    History  Alcohol Use No    History   Social History  . Marital Status: Married    Spouse Name: N/A    Number of Children: N/A  . Years of Education: N/A   Occupational History  . Not on file.   Social History Main Topics  . Smoking status: Current Every Day Smoker -- 1.50 packs/day for 35 years  . Smokeless tobacco: Not on file  . Alcohol Use: No  . Drug Use: No  . Sexually Active: Not on file   Other Topics Concern  . Not on file   Social History Narrative  . No narrative on file    No Known Allergies  Current Facility-Administered Medications  Medication Dose Route Frequency Provider Last Rate Last Dose  . 0.9 %  sodium chloride infusion   Intravenous Continuous Nimish Normajean Glasgow, MD 10 mL/hr at 04/30/13 0600    . 0.9 %  sodium chloride infusion  250 mL Intravenous PRN Manuela Schwartz, MD      . 0.9 %  sodium chloride infusion  1 mL/kg/hr Intravenous Continuous Manuela Schwartz, MD 83.1 mL/hr at 04/30/13  0600 1 mL/kg/hr at 04/30/13 0600  . 0.9 %  sodium chloride infusion  1 mL/kg/hr Intravenous Continuous Tonny Bollman, MD 83.3 mL/hr at 04/30/13 1000 1 mL/kg/hr at 04/30/13 1000  . 0.9 %  sodium chloride infusion  250 mL Intravenous PRN Tonny Bollman, MD      . acetaminophen (TYLENOL) tablet 650 mg  650 mg Oral Q4H PRN Vania Rea, MD   650 mg at 04/29/13 2329  . amLODipine (NORVASC) tablet 2.5 mg  2.5 mg Oral Daily Dayna N Dunn, PA-C   2.5 mg at 04/30/13 1009  . aspirin EC tablet 81 mg  81 mg Oral Daily Vania Rea, MD   81 mg at 04/29/13 1004  . atorvastatin (LIPITOR) tablet 40 mg  40 mg Oral q1800 Manuela Schwartz, MD   40 mg at 04/30/13 1644  . diazepam (VALIUM) tablet 2.5 mg  2.5 mg Oral On Call Manuela Schwartz, MD      . heparin ADULT infusion 100 units/mL (25000 units/250 mL)  1,250 Units/hr Intravenous Continuous Vesta Mixer, MD      . metoprolol tartrate (LOPRESSOR) tablet 25 mg  25 mg Oral BID Vania Rea, MD   25 mg at 04/30/13 1009  . nicotine (NICODERM CQ - dosed in mg/24 hours) patch 21 mg  21 mg Transdermal Daily Vania Rea, MD   21 mg at 04/30/13 1010  . nitroGLYCERIN (NITROSTAT) SL tablet 0.4 mg  0.4 mg Sublingual Q5 min PRN Geoffery Lyons, MD   0.4 mg at 04/28/13 1806  . ondansetron (ZOFRAN) tablet 4 mg  4 mg Oral Q6H PRN Vania Rea, MD       Or  . ondansetron [facility]) injection 4 mg  4 mg Intravenous Q6H PRN Vania Rea, MD      . pantoprazole (PROTONIX) EC tablet 40 mg  40 mg Oral Daily Vania Rea, MD   40 mg at 04/30/13 1009  . sodium chloride 0.9 % injection 3 mL  3 mL Intravenous Q12H Vania Rea, MD   3 mL at 04/29/13 2200  . sodium chloride 0.9 % injection 3 mL  3 mL Intravenous Q12H Manuela Schwartz, MD   3 mL at 04/30/13 1010  . sodium chloride 0.9 % injection 3 mL  3 mL Intravenous PRN Manuela Schwartz, MD      . sodium chloride 0.9 % injection 3 mL  3 mL Intravenous Q12H Tonny Bollman, MD      . sodium chloride 0.9 % injection 3 mL  3 mL Intravenous PRN Tonny Bollman, MD      . traZODone (DESYREL) tablet 50 mg  50 mg Oral QHS PRN Vania Rea, MD      . zolpidem [facility]) tablet 5 mg  5 mg Oral QHS PRN Dayna N Dunn, PA-C   5 mg at 04/29/13 2329    Prescriptions prior to admission  Medication Sig Dispense Refill  . aspirin EC 325 MG tablet Take 325 mg by mouth every morning.      . diphenhydramine-acetaminophen (TYLENOL PM) 25-500 MG TABS Take 2 tablets by mouth at bedtime.      Marland Kitchen ibuprofen (ADVIL,MOTRIN) 200 MG tablet Take 1,000 mg by mouth every 6 (six) hours as needed for pain.      Marland Kitchen omeprazole  (PRILOSEC) 20 MG capsule Take 20 mg by mouth every morning.        Family History  Problem Relation Age of Onset  . Adopted: Yes     Review of  Systems:     Cardiac Review of Systems: Y or N  Chest Pain [ yes   ]  Resting SOB [ no  ] Exertional SOB  Mahler.Beck  ]  Orthopnea no [  ]   Pedal Edema [ no  ]    Palpitations Mahler.Beck  ] Syncope  [ no ]   Presyncope [ yes  ]  General Review of Systems: [Y] = yes [  ]=no Constitional: recent weight change [  ]; anorexia [  ]; fatigue [  ]; nausea [  ]; night sweats [  ]; fever [  ]; or chills [  ]                                                               Dental: poor dentition[yes  ]; Last Dentist visit: Pending dental extractions, loose left upper front tooth   Eye : blurred vision [  ]; diplopia [   ]; vision changes [  ];  Amaurosis fugax[  ]; Resp: cough Mahler.Beck  ];  wheezing[ yes ];  hemoptysis[  ]; shortness of breath[  ]; paroxysmal nocturnal dyspnea[  ]; dyspnea on exertion[  ]; or orthopnea[  ];  GI:  gallstones[  ], vomiting[  ];  dysphagia[  ]; melena[  ];  hematochezia [  ]; heartburn[  ];   Hx of  Colonoscopy[  ]; GU: kidney stones [  ]; hematuria[  ];   dysuria [  ];  nocturia[  ];  history of     obstruction [  ]; urinary frequency [  ]             Skin: rash, swelling[  ];, hair loss[  ];  peripheral edema[  ];  or itching[  ]; Musculosketetal: myalgias[  ];  joint swelling[  ];  joint erythema[  ];  joint pain[  ];  back pain[  ];  Heme/Lymph: bruising[  ];  bleeding[  ];  anemia[  ];  Neuro: TIA[  ];  headaches[  ];  stroke[  ];  vertigo[  ];  seizures[  ];   paresthesias[  ];  difficulty walking[  ];  Psych:depression[  ]; anxiety[  ];  Endocrine: diabetes[  ];  thyroid dysfunction[  ];  Immunizations: Flu [  ]; Pneumococcal[  ];  Other: Patient was adopted  Physical Exam: BP 171/85  Pulse 76  Temp(Src) 97.8 F (36.6 C) (Oral)  Resp 21  Ht 5\' 11"  (1.803 m)  Wt 183 lb 10.3 oz (83.3 kg)  BMI 25.62 kg/m2  SpO2 98% General  appearance-middle-aged white male no acute distress HEENT normocephalic pupils equal Neck no JVD, left carotid bruit present no lymphatic enlargement Thorax scattered wheezes left greater than right no tenderness or deformity Cardiac regular rhythm without murmur or gallop Abdomen soft without pulsatile mass, no organomegaly Extremities mild clubbing no cyanosis edema or tenderness Vascular positive pulses all extremities Neuro no focal motor deficit normal affect    Diagnostic Studies & Laboratory data:     Recent Radiology Findings:   Dg Chest Portable 1 View  04/28/2013  *RADIOLOGY REPORT*  Clinical Data: Chest pain, smoker  PORTABLE CHEST - 1 VIEW  Comparison: Chest radiograph 12/06/2009  Findings: Normal mediastinum and cardiac silhouette.  Normal  pulmonary  vasculature.  No evidence of effusion, infiltrate, or pneumothorax.  No acute bony abnormality.  IMPRESSION: No acute cardiopulmonary process.   Original Report Authenticated By: Genevive Bi, M.D.       Recent Lab Findings: Lab Results  Component Value Date   WBC 12.6* 04/30/2013   HGB 14.3 04/30/2013   HCT 39.0 04/30/2013   PLT 188 04/30/2013   GLUCOSE 109* 04/30/2013   CHOL 192 04/29/2013   TRIG 234* 04/29/2013   HDL 32* 04/29/2013   LDLCALC 113* 04/29/2013   ALT 12 04/28/2013   AST 14 04/28/2013   NA 138 04/30/2013   K 3.9 04/30/2013   CL 102 04/30/2013   CREATININE 1.08 04/30/2013   BUN 24* 04/30/2013   CO2 24 04/30/2013   TSH 3.465 04/28/2013   INR 1.03 04/29/2013   HGBA1C 5.4 04/28/2013      Assessment / Plan:      Unstable angina left main and three-vessel disease Significant carotid disease-will probably need CTA of carotids and basher surgery consult prior to CABG  PFTs pending we'll place patient on inhalers preop  CABG  scheduled for may 12      @ME1 @ 04/30/2013 4:49 PM

## 2013-04-30 NOTE — Progress Notes (Signed)
  Echocardiogram 2D Echocardiogram has been performed.  Mark Carpenter 04/30/2013, 12:36 PM

## 2013-05-01 ENCOUNTER — Inpatient Hospital Stay (HOSPITAL_COMMUNITY): Payer: Self-pay

## 2013-05-01 ENCOUNTER — Inpatient Hospital Stay (HOSPITAL_COMMUNITY): Payer: MEDICAID

## 2013-05-01 ENCOUNTER — Other Ambulatory Visit: Payer: Self-pay

## 2013-05-01 DIAGNOSIS — I2510 Atherosclerotic heart disease of native coronary artery without angina pectoris: Secondary | ICD-10-CM

## 2013-05-01 DIAGNOSIS — I6529 Occlusion and stenosis of unspecified carotid artery: Secondary | ICD-10-CM

## 2013-05-01 LAB — URINALYSIS, ROUTINE W REFLEX MICROSCOPIC
Bilirubin Urine: NEGATIVE
Glucose, UA: NEGATIVE mg/dL
Hgb urine dipstick: NEGATIVE
Ketones, ur: NEGATIVE mg/dL
Leukocytes, UA: NEGATIVE
Nitrite: NEGATIVE
Protein, ur: NEGATIVE mg/dL
Specific Gravity, Urine: 1.035 — ABNORMAL HIGH (ref 1.005–1.030)
Urobilinogen, UA: 0.2 mg/dL (ref 0.0–1.0)
pH: 6 (ref 5.0–8.0)

## 2013-05-01 LAB — CBC
MCHC: 34.6 g/dL (ref 30.0–36.0)
RDW: 12.7 % (ref 11.5–15.5)

## 2013-05-01 LAB — HEPARIN LEVEL (UNFRACTIONATED): Heparin Unfractionated: 0.2 IU/mL — ABNORMAL LOW (ref 0.30–0.70)

## 2013-05-01 MED ORDER — ALBUTEROL SULFATE (5 MG/ML) 0.5% IN NEBU
2.5000 mg | INHALATION_SOLUTION | Freq: Once | RESPIRATORY_TRACT | Status: AC
  Administered 2013-05-01: 2.5 mg via RESPIRATORY_TRACT

## 2013-05-01 MED ORDER — IOHEXOL 350 MG/ML SOLN
50.0000 mL | Freq: Once | INTRAVENOUS | Status: AC | PRN
  Administered 2013-05-01: 50 mL via INTRAVENOUS

## 2013-05-01 NOTE — Progress Notes (Signed)
pft done at bedside  Mark Carpenter

## 2013-05-01 NOTE — Consult Note (Addendum)
[facility] Consult  Reason for Consult:  Carotid stenosis Referring Physician:  Alla German  [id number]  History of Present Illness: This is a 57 y.o. male who presented to the ER 3 days ago with chest pain. This has increasingly gotten worse over the past couple of weeks.  He has pain that radiates to his back and is associated with bilateral arm numbness.  Denies any visual changes or speech changes. States he does have some numbness in the right hand when he awakes at night.  He does have heartburn and this has been relieved with prilosec.  His troponins were negative then and have been throughout his hospitalization.  He was taken for a cardiac cath where he was found to have 3 vessel CAD and is in need of a CABG, which is scheduled for Monday.     During his workup, he had a carotid duplex that reveals an occluded RICA and a 60-79% occlusion in the LICA.  [facility] is consulted to evaluate the pt.  He does smoke 2 packs of cigarettes per day.  He is adopted and does not know his family history. Patient has a known history of TIA or stroke symptom.  The patient has never had amaurosis fugax or monocular blindness.  The patient has never had facial drooping or hemiplegia.  The patient has never had receptive or expressive he patient's risks factors for carotid disease include: smoking and hyperlipidemia.  Past Medical History  Diagnosis Date  . GERD (gastroesophageal reflux disease)   Hyperlipidemia  Past Surgical History  Procedure Laterality Date  . Appendectomy       History   Social History  . Marital Status: Married    Spouse Name: N/A    Number of Children: N/A  . Years of Education: N/A   Occupational History  . Not on file.   Social History Main Topics  . Smoking status: Current Every Day Smoker -- 1.50 packs/day for 35 years  . Smokeless tobacco: Not on file  . Alcohol Use: No  . Drug Use: No  . Sexually Active: Not on file   Other Topics Concern  . Not  on file   Social History Narrative  . No narrative on file    Family History  Problem Relation Age of Onset  . Adopted: Yes  Family history unknown due to adoption  Scheduled Meds: . amLODipine  2.5 mg Oral Daily  . aspirin EC  81 mg Oral Daily  . atorvastatin  40 mg Oral q1800  . metoprolol tartrate  25 mg Oral BID  . nicotine  21 mg Transdermal Daily  . pantoprazole  40 mg Oral Daily  . sodium chloride  3 mL Intravenous Q12H  . sodium chloride  3 mL Intravenous Q12H  . sodium chloride  3 mL Intravenous Q12H   Continuous Infusions: . sodium chloride 10 mL/hr at 04/30/13 0600  . sodium chloride 1 mL/kg/hr (04/30/13 0600)  . heparin 1,250 Units/hr (04/30/13 1701)   PRN Meds:.sodium chloride, sodium chloride, acetaminophen, nitroGLYCERIN, ondansetron (ZOFRAN) IV, ondansetron, sodium chloride, sodium chloride, traZODone, zolpidem  No Known Allergies  ROS: [x]  Positive   [ ]  Negative   [ ]  All sytems reviewed and are negative  General: [ ]  Weight loss, [ ]  Fever, [ ]  chills Neurologic: [ ]  Dizziness, [ ]  Blackouts, [ ]  Seizure, [ ]  Stroke, [ ]  "Mini stroke", [ ]  Slurred speech, [ ]  Temporary blindness; [ ]  weakness in arms or legs, [ ]   Hoarseness;  Cardiac: [x ] Chest pain/pressure, [ ]  Shortness of breath at rest [ ]  Shortness of breath with exertion, [ ]  Atrial fibrillation or irregular heartbeat Vascular: [ ]  Pain in legs with walking, [ ]  Pain in legs at rest, [ ]  Pain in legs at night, [x]  cramps in bilateral thighs with sex more so in the right than left,  [ ]  Non-healing ulcer, [ ]  Blood clot in vein/DVT,   Pulmonary: [ ]  Home oxygen, [x ] minimal Productive cough with white sputum, [ ]  Coughing up blood, [ ]  Asthma, [x]  wheezing,  [ ]  Wheezing Musculoskeletal:  [ ]  Arthritis, [ ]  Low back pain, [ ]  Joint pain Hematologic: [ ]  Easy Bruising, [ ]  Anemia; [ ]  Hepatitis Gastrointestinal: [ ]  Blood in stool, [x ] Gastroesophageal Reflux/heartburn, [ ]  Trouble  swallowing Urinary: [ ]  chronic Kidney disease, [ ]  on HD - [ ]  MWF or [ ]  TTHS, [ ]  Burning with urination, [ ]  Difficulty urinating Endocrine: [ ]  hx of diabetes, [ ]  hx of thyroid disease Skin: [ ]  Rashes, [ ]  Wounds Psychological: [ ]  Anxiety, [ ]  Depression  For VQI Use Only   PRE-ADM LIVING: Home  AMB STATUS: Ambulatory  CAD Sx: Unstable Angina  PRIOR CHF: None  STRESS TEST: [ x] No, [ ]  Normal, [ ]  + ischemia, [ ]  + MI, [ ]  Both  Physical Examination  Filed Vitals:   05/01/13 0735  BP: 159/84  Pulse: 77  Temp: 98.7 F (37.1 C)  Resp: 16   Body mass index is 25.76 kg/(m^2).  General:  WDWN in NAD  Gait: Not observed  HENT: WNL, normocephalic  Eyes: PERRLA, EOMI  Pulmonary: normal non-labored breathing , without Rales, rhonchi,  Wheezing  Cardiac: RRR, without  Murmurs, rubs or gallops; with bilateral carotid bruits  Vascular Exam/Pulses:bilateral radial pulses are palpable; bilateral DP pulses are palpable; difficulty palpating right femoral pulse; palpable left femoral pulse  Abdomen: soft, NT, no masses, -G/R, -HSM, - CVAT B  Skin: without rashes, without ulcers   Extremities: without ischemic changes, without Gangrene , without cellulitis; without open wounds;   Musculoskeletal: no muscle wasting or atrophy   Neurologic: A&O X 3; Appropriate Affect ; SENSATION: normal; MOTOR FUNCTION:  moving all extremities equally. Speech is fluent/normal; CN 2-12 intact  Psychiatry: judgment intact, mood and affect appropriate for clinical situation  Lymph: no cervical, axillary, or inguinal LAD  Laboratory CBC    Component Value Date/Time   WBC 9.1 05/01/2013 0445   RBC 4.62 05/01/2013 0445   HGB 14.1 05/01/2013 0445   HCT 40.8 05/01/2013 0445   PLT 175 05/01/2013 0445   MCV 88.3 05/01/2013 0445   MCH 30.5 05/01/2013 0445   MCHC 34.6 05/01/2013 0445   RDW 12.7 05/01/2013 0445    BMET    Component Value Date/Time   NA 138 04/30/2013 0440   K 3.9 04/30/2013 0440   CL  102 04/30/2013 0440   CO2 24 04/30/2013 0440   GLUCOSE 109* 04/30/2013 0440   BUN 24* 04/30/2013 0440   CREATININE 1.08 04/30/2013 0440   CALCIUM 9.1 04/30/2013 0440   GFRNONAA 74* 04/30/2013 0440   GFRAA 86* 04/30/2013 0440    Non-Invasive Vascular Imaging:  Carotid duplex 04/30/13 - Right = Occlusion of right internal carotid artery. - Left = Internal carotid artery velocities are in the60-79% range. This could be compensatory flow due to contralateral occlusion. - Antegrade vertebral flow bilaterally. - Left ICA/CCA ratio =  2.35.   ASSESSMENT/PLAN: This is a 57 y.o. male with asymptomatic carotid stenosis and unstable angina with 3V CAD  -Dr. Imogene Burn will be in to assess the pt this afternoon.  May want to get CTA of the neck to further evaluate the LICA to better determine if this will be a combined left CEA with CABG or if left CEA can be done at a later time.   Doreatha Massed, PA-C [facility] [phone]  Addendum  I have independently interviewed and examined the patient, and I agree with the physician assistant's findings.  Neurologically intact with no history of CVA or TIA.    Radiology: Ct Angio Neck W/cm &/or Wo/cm  05/01/2013  *RADIOLOGY REPORT*  Clinical Data:  Pre CABG evaluation.  Abnormal carotid Doppler. Smoker.  Right carotid occlusion on Doppler.  CT ANGIOGRAPHY NECK  Technique:  Multidetector CT imaging of the neck was performed using the standard protocol during bolus administration of intravenous contrast.  Multiplanar CT image reconstructions including MIPs were obtained to evaluate the vascular anatomy. Carotid stenosis measurements (when applicable) are obtained utilizing NASCET criteria, using the distal internal carotid diameter as the denominator.  Contrast: 50mL OMNIPAQUE IOHEXOL 350 MG/ML SOLN  Comparison:   None.  Findings:  There is apical emphysema bilaterally.  No lung nodule is present in the apices of the lungs.  No pathologic adenopathy or mass in  the neck.  Disc degeneration and spondylosis in the cervical spine most prominent at C5-6.  Right carotid:  Right common carotid artery is widely patent.  The right internal carotid artery is occluded at the origin.  There is calcified and noncalcified plaque at the origin of the internal carotid artery.  The right external carotid artery is widely patent.  The scan extends to the skull base.  The right internal carotid artery remains occluded in the cavernous segment.  Left carotid:  The left common carotid artery is widely patent. There is atherosclerotic calcified and noncalcified plaque at the carotid bifurcation.  The left internal carotid artery is narrowed to 3.3 mm diameter corresponding to 30% diameter stenosis.  60% diameter stenosis at the origin of the left external carotid artery.  Atherosclerotic calcification in the left cavernous carotid not causing significant stenosis.  Vertebral arteries:  Both vertebral arteries are patent to the basilar without stenosis or dissection.  Both vertebral arteries are equal in size.   Review of the MIP images confirms the above findings.  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  Right cavernous carotid on the right is occluded.  Atherosclerotic calcified and noncalcified plaque at the left carotid bifurcation.  30% diameter stenosis left internal carotid artery.  60% diameter stenosis left external carotid artery.  Both vertebral arteries are widely patent.   Original Report Authenticated By: Janeece Riggers, M.D.    Based on my review of the CTA, I agree the R ICA is occluded and the L ICA has < 50% stenosis.  - Subsequently, this patient has Asx L ICA < 60% and has no indication for CEA - Recommend subsequent follow up in the office for repeat B carotid duplex in 6 months ([id number]) - Pt will need close follow up on his L ICA given the contralateral occlusion.  Leonides Sake, MD [facility] [place] Office:  [phone] Pager: [phone]  05/01/2013, 11:38 PM

## 2013-05-01 NOTE — Progress Notes (Signed)
1 Day Post-Op Procedure(s) (LRB): LEFT HEART CATHETERIZATION WITH CORONARY ANGIOGRAM (N/A) INTRAVASCULAR ULTRASOUND Subjective: Unstable angina with severe three-vessel CAD Right carotid occlusion, left carotid without significant stenosis by CTA No angina while in CCU Objective: Vital signs in last 24 hours: Temp:  [98.1 F (36.7 C)-98.8 F (37.1 C)] 98.2 F (36.8 C) (05/09 1553) Pulse Rate:  [49-81] 79 (05/09 1553) Cardiac Rhythm:  [-] Normal sinus rhythm (05/09 1553) Resp:  [0-24] 20 (05/09 1553) BP: (124-174)/(68-93) 159/68 mmHg (05/09 1553) SpO2:  [95 %-98 %] 95 % (05/09 1553) Weight:  [184 lb 9.6 oz (83.734 kg)] 184 lb 9.6 oz (83.734 kg) (05/09 0500)  Hemodynamic parameters for last 24 hours:   sinus rhythm stable blood pressure  Intake/Output from previous day: 05/08 0701 - 05/09 0700 In: 1735.3 [P.O.:850; I.V.:885.3] Out: 1650 [Urine:1650] Intake/Output this shift:    Scattered wheezes and rhonchi on exam continued nebulized therapy  Lab Results:  Recent Labs  04/30/13 0440 05/01/13 0445  WBC 12.6* 9.1  HGB 14.3 14.1  HCT 39.0 40.8  PLT 188 175   BMET:  Recent Labs  04/29/13 0544 04/29/13 2257 04/30/13 0440  NA 139  --  138  K 4.1  --  3.9  CL 103  --  102  CO2 27  --  24  GLUCOSE 104*  --  109*  BUN 25*  --  24*  CREATININE 1.07 1.02 1.08  CALCIUM 9.1  --  9.1    PT/INR:  Recent Labs  04/29/13 2257  LABPROT 13.4  INR 1.03   ABG No results found for this basename: phart, pco2, po2, hco3, tco2, acidbasedef, o2sat   CBG (last 3)  No results found for this basename: GLUCAP,  in the last 72 hours  Assessment/Plan: S/P Procedure(s) (LRB): LEFT HEART CATHETERIZATION WITH CORONARY ANGIOGRAM (N/A) INTRAVASCULAR ULTRASOUND CABG May 12  -  procedure discussed with patient and wife   LOS: 3 days    VAN TRIGT III,Seraj Dunnam 05/01/2013

## 2013-05-01 NOTE — Progress Notes (Signed)
ANTICOAGULATION CONSULT NOTE   Pharmacy Consult for Heparin Indication: chest pain/ACS, s/p cath  No Known Allergies  Labs:  Recent Labs  04/28/13 2313 04/29/13 0544 04/29/13 1043 04/29/13 2257 04/30/13 0440 05/01/13 0445 05/01/13 1255  HGB  --  14.7  --  14.5 14.3 14.1  --   HCT  --  41.5  --  39.7 39.0 40.8  --   PLT  --  205  --  202 188 175  --   LABPROT  --   --   --  13.4  --   --   --   INR  --   --   --  1.03  --   --   --   HEPARINUNFRC  --   --   --   --   --  0.20* 0.26*  CREATININE  --  1.07  --  1.02 1.08  --   --   TROPONINI <0.30 <0.30 <0.30  --   --   --   --    Estimated Creatinine Clearance: 80.4 ml/min (by C-G formula based on Cr of 1.08).  Medical History: Past Medical History  Diagnosis Date  . GERD (gastroesophageal reflux disease)    Assessment: 57yo male transferred from [facility] hospital with CP.  S/p cardiac cath, plan for CABG on 5/12. On IV heparin, heparin level (0.26) is subtherapeutic.   Goal of Therapy:  Heparin level 0.3-0.7 units/ml Monitor platelets by anticoagulation protocol: Yes   Plan:  1) Increase heparin drip to 1600 units / hr 2) Follow up AM heparin level, CBC  Thank you. Okey Regal, PharmD [phone]  05/01/2013,1:47 PM

## 2013-05-01 NOTE — Progress Notes (Signed)
   SUBJECTIVE:  No chest pain.  No SOB.     PHYSICAL EXAM Filed Vitals:   05/01/13 0200 05/01/13 0340 05/01/13 0400 05/01/13 0500  BP: 154/83  147/78   Pulse: 65 69 81   Temp:  98.2 F (36.8 C) 98.1 F (36.7 C)   TempSrc:  Oral Oral   Resp: 17 24 16    Height:      Weight:    184 lb 9.6 oz (83.734 kg)  SpO2: 95% 98% 96%    General:  No distress Lungs:  Clear Heart:  RRR Abdomen:  Positive bowel sounds, no rebound no guarding Extremities:  No edema, right wrist OK.  LABS: Lab Results  Component Value Date   TROPONINI <0.30 04/29/2013   Results for orders placed during the hospital encounter of 04/28/13 (from the past 24 hour(s))  CBC     Status: None   Collection Time    05/01/13  4:45 AM      Result Value Range   WBC 9.1  4.0 - 10.5 K/uL   RBC 4.62  4.22 - 5.81 MIL/uL   Hemoglobin 14.1  13.0 - 17.0 g/dL   HCT 16.1  09.6 - 04.5 %   MCV 88.3  78.0 - 100.0 fL   MCH 30.5  26.0 - 34.0 pg   MCHC 34.6  30.0 - 36.0 g/dL   RDW 40.9  81.1 - 91.4 %   Platelets 175  150 - 400 K/uL  HEPARIN LEVEL (UNFRACTIONATED)     Status: Abnormal   Collection Time    05/01/13  4:45 AM      Result Value Range   Heparin Unfractionated 0.20 (*) 0.30 - 0.70 IU/mL    Intake/Output Summary (Last 24 hours) at 05/01/13 0713 Last data filed at 05/01/13 0600  Gross per 24 hour  Intake 1735.3 ml  Output   1650 ml  Net   85.3 ml     ASSESSMENT AND PLAN:  CAD:  He is for CABG per Dr. Donata Clay May 12.  Continue current therapy.  CAROTID STENOSIS:  Severe ICA disease.  Vascular surgery to see the patient.  Possible angiogram.   TOBACCO:  The patient is committed to not smoking.  HTN (hypertension):  BP OK.  Continue current therapy.     Fayrene Fearing [facility] 05/01/2013 7:13 AM

## 2013-05-01 NOTE — Progress Notes (Signed)
CARDIAC REHAB PHASE I   PRE:  Rate/Rhythm: 82 SR    BP: sitting 151/108    SaO2:   MODE:  Ambulation: 350 ft   POST:  Rate/Rhythm: 92 SR with PVCs    BP: sitting 181/84     SaO2:   Tolerated well with slow pace (due to high BP). Sts he felt much better getting OOB. Discussed OHS and mobility. Answered all pts questions, encouraged him to read booklet and watch video with his wife. Very appreciative. Gave pt permission to walk independently if ok with RN. Left in recliner.  [id number]   Elissa Lovett [place] CES, ACSM 05/01/2013 3:28 PM

## 2013-05-01 NOTE — Progress Notes (Signed)
ANTICOAGULATION CONSULT NOTE   Pharmacy Consult for Heparin Indication: chest pain/ACS, s/p cath  No Known Allergies  Labs:  Recent Labs  04/28/13 2313 04/29/13 0544 04/29/13 1043 04/29/13 2257 04/30/13 0440 05/01/13 0445  HGB  --  14.7  --  14.5 14.3 14.1  HCT  --  41.5  --  39.7 39.0 40.8  PLT  --  205  --  202 188 175  LABPROT  --   --   --  13.4  --   --   INR  --   --   --  1.03  --   --   HEPARINUNFRC  --   --   --   --   --  0.20*  CREATININE  --  1.07  --  1.02 1.08  --   TROPONINI <0.30 <0.30 <0.30  --   --   --    Estimated Creatinine Clearance: 80.4 ml/min (by C-G formula based on Cr of 1.08).  Medical History: Past Medical History  Diagnosis Date  . GERD (gastroesophageal reflux disease)    Medications:  Infusions:  . sodium chloride 10 mL/hr at 04/30/13 0600  . sodium chloride 1 mL/kg/hr (04/30/13 0600)  . [EXPIRED] sodium chloride 1 mL/kg/hr (04/30/13 1000)  . heparin 1,250 Units/hr (04/30/13 1701)   Assessment: 57yo male transferred from [facility] hospital with CP.  S/p cardiac cath, plan for CABG on 5/12. On IV heparin, heparin level (0.2) is subtherapeutic. hgb and plts are stable. No bleeding noted per chart  Goal of Therapy:  Heparin level 0.3-0.7 units/ml Monitor platelets by anticoagulation protocol: Yes   Plan:  1) Increase heparin drip to 1400 units / hr 2) 6 hr heparin level at 1200  Bayard Hugger, PharmD, BCPS  Clinical Pharmacist  Pager: [phone]    05/01/2013,5:25 AM

## 2013-05-02 DIAGNOSIS — R079 Chest pain, unspecified: Secondary | ICD-10-CM

## 2013-05-02 LAB — BLOOD GAS, ARTERIAL
Acid-Base Excess: 0.1 mmol/L (ref 0.0–2.0)
Bicarbonate: 24.5 mEq/L — ABNORMAL HIGH (ref 20.0–24.0)
Drawn by: 31101
FIO2: 21 %
O2 Saturation: 96.6 %
Patient temperature: 98.6
TCO2: 25.8 mmol/L (ref 0–100)
pCO2 arterial: 41.5 mmHg (ref 35.0–45.0)
pH, Arterial: 7.389 (ref 7.350–7.450)
pO2, Arterial: 78.3 mmHg — ABNORMAL LOW (ref 80.0–100.0)

## 2013-05-02 LAB — CBC
MCH: 31 pg (ref 26.0–34.0)
MCHC: 34.9 g/dL (ref 30.0–36.0)
Platelets: 180 10*3/uL (ref 150–400)
RDW: 13 % (ref 11.5–15.5)

## 2013-05-02 LAB — HEPARIN LEVEL (UNFRACTIONATED): Heparin Unfractionated: 0.54 IU/mL (ref 0.30–0.70)

## 2013-05-02 NOTE — Progress Notes (Signed)
Patient ID: Mark Carpenter, male   DOB: September 19, 1956, 57 y.o.   MRN: [id number]   SUBJECTIVE:  No chest pain.  No SOB.     PHYSICAL EXAM Filed Vitals:   05/01/13 2300 05/02/13 0330 05/02/13 0500 05/02/13 0745  BP: 147/79 138/71  156/81  Pulse: 71 68  70  Temp: 97.9 F (36.6 C) 98.2 F (36.8 C)  98.2 F (36.8 C)  TempSrc: Oral Oral  Oral  Resp: 15 19  16   Height:      Weight:   182 lb 8.7 oz (82.8 kg)   SpO2: 97% 97%  96%   General:  No distress Lungs:  Clear Heart:  RRR Abdomen:  Positive bowel sounds, no rebound no guarding Extremities:  No edema, right wrist OK.  LABS: Lab Results  Component Value Date   TROPONINI <0.30 04/29/2013   Results for orders placed during the hospital encounter of 04/28/13 (from the past 24 hour(s))  HEPARIN LEVEL (UNFRACTIONATED)     Status: Abnormal   Collection Time    05/01/13 12:55 PM      Result Value Range   Heparin Unfractionated 0.26 (*) 0.30 - 0.70 IU/mL  URINALYSIS, ROUTINE W REFLEX MICROSCOPIC     Status: Abnormal   Collection Time    05/01/13  7:25 PM      Result Value Range   Color, Urine YELLOW  YELLOW   APPearance CLEAR  CLEAR   Specific Gravity, Urine 1.035 (*) 1.005 - 1.030   pH 6.0  5.0 - 8.0   Glucose, UA NEGATIVE  NEGATIVE mg/dL   Hgb urine dipstick NEGATIVE  NEGATIVE   Bilirubin Urine NEGATIVE  NEGATIVE   Ketones, ur NEGATIVE  NEGATIVE mg/dL   Protein, ur NEGATIVE  NEGATIVE mg/dL   Urobilinogen, UA 0.2  0.0 - 1.0 mg/dL   Nitrite NEGATIVE  NEGATIVE   Leukocytes, UA NEGATIVE  NEGATIVE  BLOOD GAS, ARTERIAL     Status: Abnormal   Collection Time    05/02/13  3:18 AM      Result Value Range   FIO2 21.00     Delivery systems ROOM AIR     pH, Arterial 7.389  7.350 - 7.450   pCO2 arterial 41.5  35.0 - 45.0 mmHg   pO2, Arterial 78.3 (*) 80.0 - 100.0 mmHg   Bicarbonate 24.5 (*) 20.0 - 24.0 mEq/L   TCO2 25.8  0 - 100 mmol/L   Acid-Base Excess 0.1  0.0 - 2.0 mmol/L   O2 Saturation 96.6     Patient temperature 98.6     Collection site RIGHT RADIAL     Drawn by [id number]     Sample type ARTERIAL     Allens test (pass/fail) PASS  PASS  CBC     Status: None   Collection Time    05/02/13  4:10 AM      Result Value Range   WBC 10.5  4.0 - 10.5 K/uL   RBC 4.58  4.22 - 5.81 MIL/uL   Hemoglobin 14.2  13.0 - 17.0 g/dL   HCT 34.7  42.5 - 95.6 %   MCV 88.9  78.0 - 100.0 fL   MCH 31.0  26.0 - 34.0 pg   MCHC 34.9  30.0 - 36.0 g/dL   RDW 38.7  56.4 - 33.2 %   Platelets 180  150 - 400 K/uL  HEPARIN LEVEL (UNFRACTIONATED)     Status: None   Collection Time    05/02/13  4:10 AM  Result Value Range   Heparin Unfractionated 0.54  0.30 - 0.70 IU/mL    Intake/Output Summary (Last 24 hours) at 05/02/13 1011 Last data filed at 05/02/13 0900  Gross per 24 hour  Intake   1052 ml  Output    895 ml  Net    157 ml     ASSESSMENT AND PLAN:  CAD:  He is for CABG per Dr. Donata Clay May 12.  Continue current therapy.  CAROTID STENOSIS:  Severe ICA disease.  Vascular surgery to see the patient.  Possible angiogram.   TOBACCO:  The patient is committed to not smoking.  HTN (hypertension):  BP OK.  Continue current therapy.     Julieanna Geraci,M.D. 05/02/2013 10:11 AM

## 2013-05-02 NOTE — Progress Notes (Signed)
ANTICOAGULATION CONSULT NOTE - Follow Up Consult  Pharmacy Consult for  Heparin Indication: 3 vessel dx awaiting CABG  No Known Allergies  Patient Measurements: Height: 5\' 11"  (180.3 cm) Weight: 182 lb 8.7 oz (82.8 kg) IBW/kg (Calculated) : 75.3   Vital Signs: Temp: 98.2 F (36.8 C) (05/10 0330) Temp src: Oral (05/10 0330) BP: 138/71 mmHg (05/10 0330) Pulse Rate: 68 (05/10 0330)  Labs:  Recent Labs  04/29/13 1043  04/29/13 2257 04/30/13 0440 05/01/13 0445 05/01/13 1255 05/02/13 0410  HGB  --   < > 14.5 14.3 14.1  --  14.2  HCT  --   < > 39.7 39.0 40.8  --  40.7  PLT  --   < > 202 188 175  --  180  LABPROT  --   --  13.4  --   --   --   --   INR  --   --  1.03  --   --   --   --   HEPARINUNFRC  --   --   --   --  0.20* 0.26* 0.54  CREATININE  --   --  1.02 1.08  --   --   --   TROPONINI <0.30  --   --   --   --   --   --   < > = values in this interval not displayed.  Estimated Creatinine Clearance: 80.4 ml/min (by C-G formula based on Cr of 1.08).   Medications:  Scheduled:  . amLODipine  2.5 mg Oral Daily  . aspirin EC  81 mg Oral Daily  . atorvastatin  40 mg Oral q1800  . metoprolol tartrate  25 mg Oral BID  . nicotine  21 mg Transdermal Daily  . pantoprazole  40 mg Oral Daily  . sodium chloride  3 mL Intravenous Q12H  . sodium chloride  3 mL Intravenous Q12H  . sodium chloride  3 mL Intravenous Q12H    Assessment: 57yo male transferred from [facility] hospital with CP. S/p cardiac cath, plan for CABG on 5/12. On IV heparin, heparin level (0.54) is therapeutic this AM.  CBC stable, no noted bleeding  Goal of Therapy:  Heparin level 0.3-0.7 units/ml Monitor platelets by anticoagulation protocol: Yes   Plan:  1) Continue heparin drip to 1600 units / hr 2)  AM Heparin level, CBC 3)  Monitor for s/sx of bleeding   Bola A. Wandra Feinstein D Clinical Pharmacist Pager:[phone] Phone [phone] 05/02/2013 7:48 AM

## 2013-05-02 NOTE — Progress Notes (Signed)
2 Days Post-Op Procedure(s) (LRB): LEFT HEART CATHETERIZATION WITH CORONARY ANGIOGRAM (N/A) INTRAVASCULAR ULTRASOUND Subjective: Unstable angina with severe 3 vessel CAD Stable on IV nitroglycerin  PFTs and ABG adequate Carotid Dopplers left carotid without significant disease, right carotid chronic occlusion, asymptomatic Basher surgery recommends no surgery for carotid disease  Objective: Vital signs in last 24 hours: Temp:  [97.9 F (36.6 C)-98.3 F (36.8 C)] 98.3 F (36.8 C) (05/10 1200) Pulse Rate:  [68-79] 70 (05/10 1200) Cardiac Rhythm:  [-] Normal sinus rhythm (05/10 0800) Resp:  [15-22] 15 (05/10 1200) BP: (138-170)/(68-92) 165/76 mmHg (05/10 1200) SpO2:  [95 %-97 %] 95 % (05/10 1200) Weight:  [182 lb 8.7 oz (82.8 kg)] 182 lb 8.7 oz (82.8 kg) (05/10 0500)  Hemodynamic parameters for last 24 hours:  Sinus rhythm  Intake/Output from previous day: 05/09 0701 - 05/10 0700 In: 1312 [P.O.:700; I.V.:612] Out: 1345 [Urine:1345] Intake/Output this shift: Total I/O In: 250 [P.O.:120; I.V.:130] Out: 675 [Urine:675]  Lungs clear no hematoma    Lab Results:  Recent Labs  05/01/13 0445 05/02/13 0410  WBC 9.1 10.5  HGB 14.1 14.2  HCT 40.8 40.7  PLT 175 180   BMET:  Recent Labs  04/29/13 2257 04/30/13 0440  NA  --  138  K  --  3.9  CL  --  102  CO2  --  24  GLUCOSE  --  109*  BUN  --  24*  CREATININE 1.02 1.08  CALCIUM  --  9.1    PT/INR:  Recent Labs  04/29/13 2257  LABPROT 13.4  INR 1.03   ABG    Component Value Date/Time   PHART 7.389 05/02/2013 0318   HCO3 24.5* 05/02/2013 0318   TCO2 25.8 05/02/2013 0318   O2SAT 96.6 05/02/2013 0318   CBG (last 3)  No results found for this basename: GLUCAP,  in the last 72 hours  Assessment/Plan: S/P Procedure(s) (LRB): LEFT HEART CATHETERIZATION WITH CORONARY ANGIOGRAM (N/A) INTRAVASCULAR ULTRASOUND CABG on Monday, May 12    LOS: 4 days    VAN TRIGT III,Nicci Vaughan 05/02/2013

## 2013-05-02 NOTE — Progress Notes (Signed)
CARDIAC REHAB PHASE I   PRE:  Rate/Rhythm: 69 SR    BP: sitting 153/84    SaO2:   MODE:  Ambulation: 700 ft   POST:  Rate/Rhythm: 80 SR    BP: sitting 165/76     SaO2:   Tolerated well, no c/o. Discussed plans to quit smoking. BP more controlled today. To walk independently. [id number]   Elissa Lovett [place] CES, ACSM 05/02/2013 11:29 AM

## 2013-05-02 NOTE — Progress Notes (Signed)
Preparing for heart surgery video shown at 0920

## 2013-05-03 ENCOUNTER — Inpatient Hospital Stay (HOSPITAL_COMMUNITY): Payer: MEDICAID

## 2013-05-03 LAB — COMPREHENSIVE METABOLIC PANEL
ALT: 41 U/L (ref 0–53)
AST: 33 U/L (ref 0–37)
Albumin: 3.6 g/dL (ref 3.5–5.2)
Alkaline Phosphatase: 110 U/L (ref 39–117)
BUN: 25 mg/dL — ABNORMAL HIGH (ref 6–23)
CO2: 29 mEq/L (ref 19–32)
Calcium: 9.1 mg/dL (ref 8.4–10.5)
Chloride: 100 mEq/L (ref 96–112)
Creatinine, Ser: 1.17 mg/dL (ref 0.50–1.35)
GFR calc Af Amer: 78 mL/min — ABNORMAL LOW (ref >90)
GFR calc non Af Amer: 68 mL/min — ABNORMAL LOW (ref >90)
Glucose, Bld: 101 mg/dL — ABNORMAL HIGH (ref 70–99)
Potassium: 4.1 mEq/L (ref 3.5–5.1)
Sodium: 137 mEq/L (ref 135–145)
Total Bilirubin: 0.2 mg/dL — ABNORMAL LOW (ref 0.3–1.2)
Total Protein: 6.8 g/dL (ref 6.0–8.3)

## 2013-05-03 LAB — BLOOD GAS, ARTERIAL
Acid-Base Excess: 0.8 mmol/L (ref 0.0–2.0)
Bicarbonate: 24.7 mEq/L — ABNORMAL HIGH (ref 20.0–24.0)
Drawn by: 317771
O2 Saturation: 95.7 %
Patient temperature: 98.6
TCO2: 25.8 mmol/L (ref 0–100)
pCO2 arterial: 37.9 mmHg (ref 35.0–45.0)
pH, Arterial: 7.43 (ref 7.350–7.450)
pO2, Arterial: 75.6 mmHg — ABNORMAL LOW (ref 80.0–100.0)

## 2013-05-03 LAB — CBC
HCT: 41.6 % (ref 39.0–52.0)
HCT: 41.5 % (ref 39.0–52.0)
Hemoglobin: 15 g/dL (ref 13.0–17.0)
Hemoglobin: 14.5 g/dL (ref 13.0–17.0)
MCH: 30.8 pg (ref 26.0–34.0)
MCHC: 36.1 g/dL — ABNORMAL HIGH (ref 30.0–36.0)
MCHC: 34.9 g/dL (ref 30.0–36.0)

## 2013-05-03 LAB — SURGICAL PCR SCREEN
MRSA, PCR: NEGATIVE
Staphylococcus aureus: NEGATIVE

## 2013-05-03 MED ORDER — SODIUM CHLORIDE 0.9 % IV SOLN
INTRAVENOUS | Status: DC
  Filled 2013-05-03: qty 30

## 2013-05-03 MED ORDER — SODIUM CHLORIDE 0.9 % IV SOLN
INTRAVENOUS | Status: AC
  Administered 2013-05-04: 69.8 mL/h via INTRAVENOUS
  Filled 2013-05-03: qty 40

## 2013-05-03 MED ORDER — DOPAMINE-DEXTROSE 3.2-5 MG/ML-% IV SOLN
2.0000 ug/kg/min | INTRAVENOUS | Status: DC
  Filled 2013-05-03: qty 250

## 2013-05-03 MED ORDER — PHENYLEPHRINE HCL 10 MG/ML IJ SOLN
30.0000 ug/min | INTRAVENOUS | Status: DC
  Filled 2013-05-03: qty 2

## 2013-05-03 MED ORDER — SODIUM CHLORIDE 0.9 % IV SOLN
INTRAVENOUS | Status: DC
  Filled 2013-05-03: qty 1

## 2013-05-03 MED ORDER — DIAZEPAM 5 MG PO TABS: 5.0000 mg | ORAL_TABLET | ORAL | Status: DC | PRN

## 2013-05-03 MED ORDER — NITROGLYCERIN IN D5W 200-5 MCG/ML-% IV SOLN
2.0000 ug/min | INTRAVENOUS | Status: AC
  Administered 2013-05-04: 5 ug/min via INTRAVENOUS
  Filled 2013-05-03: qty 250

## 2013-05-03 MED ORDER — AMLODIPINE BESYLATE 2.5 MG PO TABS
2.5000 mg | ORAL_TABLET | Freq: Once | ORAL | Status: AC
  Administered 2013-05-03: 2.5 mg via ORAL
  Filled 2013-05-03 (×4): qty 1

## 2013-05-03 MED ORDER — MAGNESIUM SULFATE 50 % IJ SOLN
40.0000 meq | INTRAMUSCULAR | Status: DC
  Filled 2013-05-03: qty 10

## 2013-05-03 MED ORDER — TRANEXAMIC ACID 100 MG/ML IV SOLN: 1.5000 mg/kg/h | INTRAVENOUS | Status: DC

## 2013-05-03 MED ORDER — SODIUM CHLORIDE 0.9 % IV SOLN
INTRAVENOUS | Status: DC
  Filled 2013-05-03: qty 40

## 2013-05-03 MED ORDER — TEMAZEPAM 15 MG PO CAPS: 15.0000 mg | ORAL_CAPSULE | Freq: Once | ORAL | Status: AC | PRN

## 2013-05-03 MED ORDER — CHLORHEXIDINE GLUCONATE 4 % EX LIQD
60.0000 mL | Freq: Once | CUTANEOUS | Status: AC
  Administered 2013-05-04: 4 via TOPICAL

## 2013-05-03 MED ORDER — DEXTROSE 5 % IV SOLN
750.0000 mg | INTRAVENOUS | Status: DC
  Filled 2013-05-03 (×2): qty 750

## 2013-05-03 MED ORDER — EPINEPHRINE HCL 1 MG/ML IJ SOLN
0.5000 ug/min | INTRAVENOUS | Status: DC
  Filled 2013-05-03: qty 4

## 2013-05-03 MED ORDER — DIAZEPAM 5 MG PO TABS
5.0000 mg | ORAL_TABLET | Freq: Once | ORAL | Status: AC
  Administered 2013-05-04: 5 mg via ORAL
  Filled 2013-05-03: qty 1

## 2013-05-03 MED ORDER — NITROGLYCERIN IN D5W 200-5 MCG/ML-% IV SOLN: 2.0000 ug/min | INTRAVENOUS | Status: DC

## 2013-05-03 MED ORDER — POTASSIUM CHLORIDE 2 MEQ/ML IV SOLN
80.0000 meq | INTRAVENOUS | Status: DC
  Filled 2013-05-03: qty 40

## 2013-05-03 MED ORDER — DEXTROSE 5 % IV SOLN
1.5000 g | INTRAVENOUS | Status: DC
  Filled 2013-05-03: qty 1.5

## 2013-05-03 MED ORDER — VANCOMYCIN HCL 10 G IV SOLR
1250.0000 mg | INTRAVENOUS | Status: DC
  Filled 2013-05-03: qty 1250

## 2013-05-03 MED ORDER — BISACODYL 5 MG PO TBEC: 5.0000 mg | DELAYED_RELEASE_TABLET | Freq: Once | ORAL | Status: DC

## 2013-05-03 MED ORDER — PLASMA-LYTE 148 IV SOLN
INTRAVENOUS | Status: AC
  Administered 2013-05-04: 09:00:00
  Filled 2013-05-03: qty 2.5

## 2013-05-03 MED ORDER — DEXMEDETOMIDINE HCL IN NACL 400 MCG/100ML IV SOLN
0.1000 ug/kg/h | INTRAVENOUS | Status: AC
  Administered 2013-05-04: 0.2 ug/kg/h via INTRAVENOUS
  Filled 2013-05-03: qty 100

## 2013-05-03 MED ORDER — AMLODIPINE BESYLATE 5 MG PO TABS
5.0000 mg | ORAL_TABLET | Freq: Every day | ORAL | Status: DC
  Filled 2013-05-03: qty 1

## 2013-05-03 MED ORDER — DOPAMINE-DEXTROSE 3.2-5 MG/ML-% IV SOLN: 2.0000 ug/kg/min | INTRAVENOUS | Status: DC

## 2013-05-03 MED ORDER — SODIUM CHLORIDE 0.9 % IV SOLN
INTRAVENOUS | Status: AC
  Administered 2013-05-04: 1 [IU]/h via INTRAVENOUS
  Filled 2013-05-03: qty 1

## 2013-05-03 MED ORDER — TRANEXAMIC ACID (OHS) BOLUS VIA INFUSION: 15.0000 mg/kg | INTRAVENOUS | Status: DC

## 2013-05-03 MED ORDER — DEXTROSE 5 % IV SOLN
1.5000 g | INTRAVENOUS | Status: AC
  Administered 2013-05-04: .75 g via INTRAVENOUS
  Administered 2013-05-04: 1.5 g via INTRAVENOUS
  Filled 2013-05-03 (×2): qty 1.5

## 2013-05-03 MED ORDER — CHLORHEXIDINE GLUCONATE 4 % EX LIQD
60.0000 mL | Freq: Once | CUTANEOUS | Status: AC
  Administered 2013-05-03: 4 via TOPICAL
  Filled 2013-05-03: qty 60

## 2013-05-03 MED ORDER — VANCOMYCIN HCL 10 G IV SOLR
1250.0000 mg | INTRAVENOUS | Status: AC
  Administered 2013-05-04: 1250 mg via INTRAVENOUS
  Filled 2013-05-03: qty 1250

## 2013-05-03 MED ORDER — METOPROLOL TARTRATE 12.5 MG HALF TABLET
12.5000 mg | ORAL_TABLET | Freq: Once | ORAL | Status: AC
  Administered 2013-05-04: 12.5 mg via ORAL
  Filled 2013-05-03: qty 1

## 2013-05-03 MED ORDER — DEXMEDETOMIDINE HCL IN NACL 400 MCG/100ML IV SOLN: 0.1000 ug/kg/h | INTRAVENOUS | Status: DC

## 2013-05-03 MED ORDER — BUDESONIDE-FORMOTEROL FUMARATE 160-4.5 MCG/ACT IN AERO
2.0000 | INHALATION_SPRAY | Freq: Two times a day (BID) | RESPIRATORY_TRACT | Status: DC
  Administered 2013-05-03: 2 via RESPIRATORY_TRACT
  Filled 2013-05-03: qty 6

## 2013-05-03 MED ORDER — SODIUM CHLORIDE 0.9 % IV SOLN: INTRAVENOUS | Status: DC

## 2013-05-03 MED ORDER — DEXTROSE 5 % IV SOLN
750.0000 mg | INTRAVENOUS | Status: DC
  Filled 2013-05-03: qty 750

## 2013-05-03 MED ORDER — PLASMA-LYTE 148 IV SOLN
INTRAVENOUS | Status: DC
  Filled 2013-05-03: qty 2.5

## 2013-05-03 NOTE — Plan of Care (Signed)
Problem: Phase II Progression Outcomes Goal: Other Phase II Outcomes/Goals Outcome: Progressing Pre/post op teaching in progress the patient using incentive spirometer correctly .Calf-pumping exercises taught .

## 2013-05-03 NOTE — Progress Notes (Signed)
Patient ID: Mark Carpenter, male   DOB: 29-Jul-1956, 57 y.o.   MRN: [id number]  SUBJECTIVE:  No chest pain.  No SOB.     PHYSICAL EXAM Filed Vitals:   05/02/13 2351 05/03/13 0358 05/03/13 0400 05/03/13 0753  BP: 146/83 160/87  169/90  Pulse:    70  Temp: 98 F (36.7 C) 98.3 F (36.8 C)  98.1 F (36.7 C)  TempSrc: Oral Oral    Resp:      Height:      Weight:   181 lb (82.101 kg)   SpO2: 97% 98%  96%   General:  No distress Lungs:  Clear Heart:  RRR Abdomen:  Positive bowel sounds, no rebound no guarding Extremities:  No edema, right wrist OK.  LABS: Lab Results  Component Value Date   TROPONINI <0.30 04/29/2013   Results for orders placed during the hospital encounter of 04/28/13 (from the past 24 hour(s))  CBC     Status: Abnormal   Collection Time    05/03/13  4:13 AM      Result Value Range   WBC 9.5  4.0 - 10.5 K/uL   RBC 4.71  4.22 - 5.81 MIL/uL   Hemoglobin 15.0  13.0 - 17.0 g/dL   HCT 40.9  81.1 - 91.4 %   MCV 88.3  78.0 - 100.0 fL   MCH 31.8  26.0 - 34.0 pg   MCHC 36.1 (*) 30.0 - 36.0 g/dL   RDW 78.2  95.6 - 21.3 %   Platelets 179  150 - 400 K/uL  HEPARIN LEVEL (UNFRACTIONATED)     Status: None   Collection Time    05/03/13  4:13 AM      Result Value Range   Heparin Unfractionated 0.57  0.30 - 0.70 IU/mL    Intake/Output Summary (Last 24 hours) at 05/03/13 0805 Last data filed at 05/03/13 0700  Gross per 24 hour  Intake   1398 ml  Output   1325 ml  Net     73 ml     ASSESSMENT AND PLAN:  CAD:  He is for CABG per Dr. Donata Clay May 12.  Continue current therapy.  CAROTID STENOSIS:  Severe ICA disease.  Vascular surgery to see the patient.  Possible angiogram.   TOBACCO:  The patient is committed to not smoking.  HTN (hypertension):  BP OK.  Continue current therapy.     Gregg Taylor,M.D. 05/03/2013 8:05 AM

## 2013-05-03 NOTE — Progress Notes (Signed)
ANTICOAGULATION CONSULT NOTE - Follow Up Consult  Pharmacy Consult for  Heparin Indication: 3 vessel dx awaiting CABG  No Known Allergies  Patient Measurements: Height: 5\' 11"  (180.3 cm) Weight: 181 lb (82.101 kg) IBW/kg (Calculated) : 75.3   Vital Signs: Temp: 98.3 F (36.8 C) (05/11 0358) Temp src: Oral (05/11 0358) BP: 160/87 mmHg (05/11 0358)  Labs:  Recent Labs  05/01/13 0445 05/01/13 1255 05/02/13 0410 05/03/13 0413  HGB 14.1  --  14.2 15.0  HCT 40.8  --  40.7 41.6  PLT 175  --  180 179  HEPARINUNFRC 0.20* 0.26* 0.54 0.57    Estimated Creatinine Clearance: 80.4 ml/min (by C-G formula based on Cr of 1.08).   Medications:  Scheduled:  . amLODipine  2.5 mg Oral Daily  . aspirin EC  81 mg Oral Daily  . atorvastatin  40 mg Oral q1800  . metoprolol tartrate  25 mg Oral BID  . nicotine  21 mg Transdermal Daily  . pantoprazole  40 mg Oral Daily  . sodium chloride  3 mL Intravenous Q12H  . sodium chloride  3 mL Intravenous Q12H  . [DISCONTINUED] sodium chloride  3 mL Intravenous Q12H    Assessment: 57yo male transferred from [facility] hospital with CP. S/p cardiac cath, plan for CABG on 5/12. On IV heparin, heparin level (0.57) is therapeutic this AM.  CBC stable, no noted bleeding  Goal of Therapy:  Heparin level 0.3-0.7 units/ml Monitor platelets by anticoagulation protocol: Yes   Plan:  1)  Continue heparin drip at 1600 units / hr 2)  AM Heparin level, CBC 3)  Monitor for s/sx of bleeding   Bola A. Wandra Feinstein D Clinical Pharmacist Pager:[phone] Phone [phone] 05/03/2013 7:42 AM

## 2013-05-04 ENCOUNTER — Encounter (HOSPITAL_COMMUNITY): Payer: Self-pay | Admitting: Certified Registered Nurse Anesthetist

## 2013-05-04 ENCOUNTER — Encounter (HOSPITAL_COMMUNITY)
Admission: EM | Disposition: A | Discharge status: Home / Self Care | Payer: Self-pay | Source: Home / Self Care | Attending: Cardiothoracic Surgery

## 2013-05-04 ENCOUNTER — Inpatient Hospital Stay (HOSPITAL_COMMUNITY): Payer: MEDICAID

## 2013-05-04 ENCOUNTER — Inpatient Hospital Stay (HOSPITAL_COMMUNITY): Payer: MEDICAID | Admitting: Certified Registered Nurse Anesthetist

## 2013-05-04 DIAGNOSIS — I2510 Atherosclerotic heart disease of native coronary artery without angina pectoris: Secondary | ICD-10-CM

## 2013-05-04 HISTORY — PX: INTRAOPERATIVE TRANSESOPHAGEAL ECHOCARDIOGRAM: SHX5062

## 2013-05-04 HISTORY — PX: CORONARY ARTERY BYPASS GRAFT: SHX141

## 2013-05-04 LAB — URINALYSIS, ROUTINE W REFLEX MICROSCOPIC
Bilirubin Urine: NEGATIVE
Glucose, UA: NEGATIVE mg/dL
Hgb urine dipstick: NEGATIVE
Ketones, ur: NEGATIVE mg/dL
Leukocytes, UA: NEGATIVE
Nitrite: NEGATIVE
Protein, ur: NEGATIVE mg/dL
Specific Gravity, Urine: 1.022 (ref 1.005–1.030)
Urobilinogen, UA: 1 mg/dL (ref 0.0–1.0)
pH: 6 (ref 5.0–8.0)

## 2013-05-04 LAB — POCT I-STAT 3, ART BLOOD GAS (G3+)
Acid-base deficit: 4 mmol/L — ABNORMAL HIGH (ref 0.0–2.0)
Acid-base deficit: 3 mmol/L — ABNORMAL HIGH (ref 0.0–2.0)
Acid-base deficit: 4 mmol/L — ABNORMAL HIGH (ref 0.0–2.0)
Bicarbonate: 25.3 mEq/L — ABNORMAL HIGH (ref 20.0–24.0)
Bicarbonate: 21.2 mEq/L (ref 20.0–24.0)
Bicarbonate: 21.4 mEq/L (ref 20.0–24.0)
O2 Saturation: 98 %
O2 Saturation: 97 %
O2 Saturation: 91 %
Patient temperature: 36.4
Patient temperature: 37
TCO2: 27 mmol/L (ref 0–100)
TCO2: 22 mmol/L (ref 0–100)
TCO2: 23 mmol/L (ref 0–100)
pCO2 arterial: 46.1 mmHg — ABNORMAL HIGH (ref 35.0–45.0)
pCO2 arterial: 40.5 mmHg (ref 35.0–45.0)
pH, Arterial: 7.348 — ABNORMAL LOW (ref 7.350–7.450)
pH, Arterial: 7.327 — ABNORMAL LOW (ref 7.350–7.450)
pO2, Arterial: 327 mmHg — ABNORMAL HIGH (ref 80.0–100.0)
pO2, Arterial: 113 mmHg — ABNORMAL HIGH (ref 80.0–100.0)
pO2, Arterial: 76 mmHg — ABNORMAL LOW (ref 80.0–100.0)
pO2, Arterial: 66 mmHg — ABNORMAL LOW (ref 80.0–100.0)

## 2013-05-04 LAB — CBC
HCT: 32.4 % — ABNORMAL LOW (ref 39.0–52.0)
HCT: 31.9 % — ABNORMAL LOW (ref 39.0–52.0)
Hemoglobin: 11.3 g/dL — ABNORMAL LOW (ref 13.0–17.0)
Hemoglobin: 11.2 g/dL — ABNORMAL LOW (ref 13.0–17.0)
MCH: 30.6 pg (ref 26.0–34.0)
MCH: 30.9 pg (ref 26.0–34.0)
MCHC: 35.1 g/dL (ref 30.0–36.0)
MCV: 87.8 fL (ref 78.0–100.0)
MCV: 88.1 fL (ref 78.0–100.0)
Platelets: 151 10*3/uL (ref 150–400)
RBC: 3.69 MIL/uL — ABNORMAL LOW (ref 4.22–5.81)
RBC: 3.62 MIL/uL — ABNORMAL LOW (ref 4.22–5.81)
RDW: 13.1 % (ref 11.5–15.5)
WBC: 13.8 10*3/uL — ABNORMAL HIGH (ref 4.0–10.5)
WBC: 16.7 10*3/uL — ABNORMAL HIGH (ref 4.0–10.5)

## 2013-05-04 LAB — BASIC METABOLIC PANEL
BUN: 27 mg/dL — ABNORMAL HIGH (ref 6–23)
CO2: 29 mEq/L (ref 19–32)
Calcium: 9.5 mg/dL (ref 8.4–10.5)
Chloride: 101 mEq/L (ref 96–112)
Creatinine, Ser: 1.36 mg/dL — ABNORMAL HIGH (ref 0.50–1.35)
GFR calc Af Amer: 65 mL/min — ABNORMAL LOW (ref >90)
GFR calc non Af Amer: 56 mL/min — ABNORMAL LOW (ref >90)
Glucose, Bld: 130 mg/dL — ABNORMAL HIGH (ref 70–99)
Potassium: 3.9 mEq/L (ref 3.5–5.1)
Sodium: 138 mEq/L (ref 135–145)

## 2013-05-04 LAB — POCT I-STAT 4, (NA,K, GLUC, HGB,HCT)
Glucose, Bld: 113 mg/dL — ABNORMAL HIGH (ref 70–99)
Glucose, Bld: 117 mg/dL — ABNORMAL HIGH (ref 70–99)
Glucose, Bld: 101 mg/dL — ABNORMAL HIGH (ref 70–99)
HCT: 39 % (ref 39.0–52.0)
HCT: 24 % — ABNORMAL LOW (ref 39.0–52.0)
HCT: 33 % — ABNORMAL LOW (ref 39.0–52.0)
Hemoglobin: 9.2 g/dL — ABNORMAL LOW (ref 13.0–17.0)
Hemoglobin: 11.2 g/dL — ABNORMAL LOW (ref 13.0–17.0)
Potassium: 4.8 mEq/L (ref 3.5–5.1)
Potassium: 4.3 mEq/L (ref 3.5–5.1)
Potassium: 3.8 mEq/L (ref 3.5–5.1)
Sodium: 140 mEq/L (ref 135–145)
Sodium: 129 mEq/L — ABNORMAL LOW (ref 135–145)
Sodium: 133 mEq/L — ABNORMAL LOW (ref 135–145)
Sodium: 137 mEq/L (ref 135–145)

## 2013-05-04 LAB — CREATININE, SERUM
Creatinine, Ser: 1.15 mg/dL (ref 0.50–1.35)
GFR calc Af Amer: 80 mL/min — ABNORMAL LOW (ref >90)
GFR calc non Af Amer: 69 mL/min — ABNORMAL LOW (ref >90)

## 2013-05-04 LAB — TYPE AND SCREEN
ABO/RH(D): O NEG
Antibody Screen: NEGATIVE

## 2013-05-04 LAB — ABO/RH: ABO/RH(D): O NEG

## 2013-05-04 LAB — APTT
aPTT: 126 s — ABNORMAL HIGH (ref 24–37)
aPTT: 40 seconds — ABNORMAL HIGH (ref 24–37)

## 2013-05-04 LAB — POCT I-STAT, CHEM 8
BUN: 19 mg/dL (ref 6–23)
Calcium, Ion: 1.23 mmol/L (ref 1.12–1.23)
Chloride: 106 mEq/L (ref 96–112)
Creatinine, Ser: 1.1 mg/dL (ref 0.50–1.35)
Glucose, Bld: 152 mg/dL — ABNORMAL HIGH (ref 70–99)
TCO2: 22 mmol/L (ref 0–100)

## 2013-05-04 LAB — MAGNESIUM: Magnesium: 2.7 mg/dL — ABNORMAL HIGH (ref 1.5–2.5)

## 2013-05-04 LAB — HEMOGLOBIN AND HEMATOCRIT, BLOOD
HCT: 23.5 % — ABNORMAL LOW (ref 39.0–52.0)
Hemoglobin: 8.5 g/dL — ABNORMAL LOW (ref 13.0–17.0)

## 2013-05-04 LAB — GLUCOSE, CAPILLARY
Glucose-Capillary: 101 mg/dL — ABNORMAL HIGH (ref 70–99)
Glucose-Capillary: 127 mg/dL — ABNORMAL HIGH (ref 70–99)

## 2013-05-04 LAB — PLATELET COUNT: Platelets: 110 10*3/uL — ABNORMAL LOW (ref 150–400)

## 2013-05-04 SURGERY — CORONARY ARTERY BYPASS GRAFTING (CABG)
Anesthesia: General | Site: Chest | Wound class: Clean

## 2013-05-04 MED ORDER — FENTANYL CITRATE 0.05 MG/ML IJ SOLN
INTRAMUSCULAR | Status: DC | PRN
  Administered 2013-05-04: 50 ug via INTRAVENOUS
  Administered 2013-05-04: 650 ug via INTRAVENOUS
  Administered 2013-05-04: 450 ug via INTRAVENOUS
  Administered 2013-05-04: 150 ug via INTRAVENOUS
  Administered 2013-05-04 (×2): 100 ug via INTRAVENOUS
  Administered 2013-05-04 (×2): 250 ug via INTRAVENOUS

## 2013-05-04 MED ORDER — VANCOMYCIN HCL IN DEXTROSE 1-5 GM/200ML-% IV SOLN
1000.0000 mg | Freq: Once | INTRAVENOUS | Status: DC
  Filled 2013-05-04: qty 200

## 2013-05-04 MED ORDER — SODIUM CHLORIDE 0.9 % IJ SOLN
3.0000 mL | Freq: Two times a day (BID) | INTRAMUSCULAR | Status: DC
  Administered 2013-05-05 – 2013-05-10 (×10): 3 mL via INTRAVENOUS

## 2013-05-04 MED ORDER — LACTATED RINGERS IV SOLN: 500.0000 mL | Freq: Once | INTRAVENOUS | Status: DC | PRN

## 2013-05-04 MED ORDER — LACTATED RINGERS IV SOLN
INTRAVENOUS | Status: DC | PRN
  Administered 2013-05-04: 07:00:00 via INTRAVENOUS

## 2013-05-04 MED ORDER — METOPROLOL TARTRATE 12.5 MG HALF TABLET
12.5000 mg | ORAL_TABLET | Freq: Two times a day (BID) | ORAL | Status: DC
  Administered 2013-05-04 – 2013-05-10 (×12): 12.5 mg via ORAL
  Filled 2013-05-04 (×13): qty 1

## 2013-05-04 MED ORDER — LEVALBUTEROL HCL 0.63 MG/3ML IN NEBU: 0.6300 mg | INHALATION_SOLUTION | Freq: Three times a day (TID) | RESPIRATORY_TRACT | Status: DC

## 2013-05-04 MED ORDER — SODIUM CHLORIDE 0.9 % IJ SOLN: 3.0000 mL | INTRAMUSCULAR | Status: DC | PRN

## 2013-05-04 MED ORDER — SODIUM CHLORIDE 0.9 % IV SOLN: 250.0000 mL | INTRAVENOUS | Status: DC

## 2013-05-04 MED ORDER — BUDESONIDE-FORMOTEROL FUMARATE 160-4.5 MCG/ACT IN AERO
2.0000 | INHALATION_SPRAY | Freq: Two times a day (BID) | RESPIRATORY_TRACT | Status: DC
  Administered 2013-05-04 – 2013-05-10 (×12): 2 via RESPIRATORY_TRACT
  Filled 2013-05-04: qty 6

## 2013-05-04 MED ORDER — DOPAMINE-DEXTROSE 3.2-5 MG/ML-% IV SOLN
INTRAVENOUS | Status: DC | PRN
  Administered 2013-05-04: 3 ug/kg/min via INTRAVENOUS

## 2013-05-04 MED ORDER — ROCURONIUM BROMIDE 100 MG/10ML IV SOLN
INTRAVENOUS | Status: DC | PRN
  Administered 2013-05-04 (×4): 50 mg via INTRAVENOUS

## 2013-05-04 MED ORDER — LACTATED RINGERS IV SOLN
INTRAVENOUS | Status: DC | PRN
  Administered 2013-05-04 (×2): via INTRAVENOUS

## 2013-05-04 MED ORDER — SODIUM CHLORIDE 0.9 % IV SOLN
INTRAVENOUS | Status: DC
  Administered 2013-05-04: 14:00:00 via INTRAVENOUS

## 2013-05-04 MED ORDER — POTASSIUM CHLORIDE 10 MEQ/50ML IV SOLN
10.0000 meq | INTRAVENOUS | Status: AC
  Administered 2013-05-04 (×3): 10 meq via INTRAVENOUS

## 2013-05-04 MED ORDER — OXYCODONE HCL 5 MG PO TABS
5.0000 mg | ORAL_TABLET | ORAL | Status: DC | PRN
  Administered 2013-05-04 – 2013-05-08 (×7): 10 mg via ORAL
  Administered 2013-05-08: 5 mg via ORAL
  Administered 2013-05-08: 10 mg via ORAL
  Administered 2013-05-08: 5 mg via ORAL
  Administered 2013-05-08 – 2013-05-10 (×9): 10 mg via ORAL
  Filled 2013-05-04 (×6): qty 2
  Filled 2013-05-04: qty 1
  Filled 2013-05-04 (×10): qty 2
  Filled 2013-05-04: qty 1

## 2013-05-04 MED ORDER — HEPARIN SODIUM (PORCINE) 1000 UNIT/ML IJ SOLN
INTRAMUSCULAR | Status: DC | PRN
  Administered 2013-05-04: 2000 [IU] via INTRAVENOUS
  Administered 2013-05-04: 5000 [IU] via INTRAVENOUS
  Administered 2013-05-04: 25000 [IU] via INTRAVENOUS

## 2013-05-04 MED ORDER — ACETAMINOPHEN 500 MG PO TABS
1000.0000 mg | ORAL_TABLET | Freq: Four times a day (QID) | ORAL | Status: AC
  Administered 2013-05-04 – 2013-05-09 (×15): 1000 mg via ORAL
  Filled 2013-05-04 (×20): qty 2

## 2013-05-04 MED ORDER — VANCOMYCIN HCL IN DEXTROSE 1-5 GM/200ML-% IV SOLN
1000.0000 mg | Freq: Two times a day (BID) | INTRAVENOUS | Status: AC
  Administered 2013-05-04 – 2013-05-05 (×3): 1000 mg via INTRAVENOUS
  Filled 2013-05-04 (×3): qty 200

## 2013-05-04 MED ORDER — PROPOFOL 10 MG/ML IV BOLUS
INTRAVENOUS | Status: DC | PRN
  Administered 2013-05-04: 70 mg via INTRAVENOUS

## 2013-05-04 MED ORDER — INSULIN ASPART 100 UNIT/ML ~~LOC~~ SOLN
0.0000 [IU] | SUBCUTANEOUS | Status: AC
  Administered 2013-05-04 (×2): 2 [IU] via SUBCUTANEOUS

## 2013-05-04 MED ORDER — FAMOTIDINE IN NACL 20-0.9 MG/50ML-% IV SOLN
20.0000 mg | Freq: Two times a day (BID) | INTRAVENOUS | Status: AC
  Administered 2013-05-04 (×2): 20 mg via INTRAVENOUS
  Filled 2013-05-04: qty 50

## 2013-05-04 MED ORDER — ALBUMIN HUMAN 5 % IV SOLN
INTRAVENOUS | Status: DC | PRN
  Administered 2013-05-04: 13:00:00 via INTRAVENOUS

## 2013-05-04 MED ORDER — SODIUM CHLORIDE 0.9 % IV SOLN
INTRAVENOUS | Status: DC
  Filled 2013-05-04: qty 1

## 2013-05-04 MED ORDER — PHENYLEPHRINE HCL 10 MG/ML IJ SOLN
10.0000 mg | INTRAVENOUS | Status: DC | PRN
  Administered 2013-05-04: 50 ug/min via INTRAVENOUS

## 2013-05-04 MED ORDER — DOPAMINE-DEXTROSE 3.2-5 MG/ML-% IV SOLN: 0.0000 ug/kg/min | INTRAVENOUS | Status: DC

## 2013-05-04 MED ORDER — METOPROLOL TARTRATE 25 MG/10 ML ORAL SUSPENSION
12.5000 mg | Freq: Two times a day (BID) | ORAL | Status: DC
  Filled 2013-05-04 (×3): qty 5

## 2013-05-04 MED ORDER — ARTIFICIAL TEARS OP OINT
TOPICAL_OINTMENT | OPHTHALMIC | Status: DC | PRN
  Administered 2013-05-04: 1 via OPHTHALMIC

## 2013-05-04 MED ORDER — ALBUMIN HUMAN 5 % IV SOLN: 250.0000 mL | INTRAVENOUS | Status: AC | PRN

## 2013-05-04 MED ORDER — PANTOPRAZOLE SODIUM 40 MG PO TBEC
40.0000 mg | DELAYED_RELEASE_TABLET | Freq: Every day | ORAL | Status: DC
  Administered 2013-05-06 – 2013-05-10 (×5): 40 mg via ORAL
  Filled 2013-05-04 (×4): qty 1

## 2013-05-04 MED ORDER — INSULIN ASPART 100 UNIT/ML ~~LOC~~ SOLN
0.0000 [IU] | SUBCUTANEOUS | Status: DC
  Administered 2013-05-05: 01:00:00 via SUBCUTANEOUS
  Administered 2013-05-05: 2 [IU] via SUBCUTANEOUS

## 2013-05-04 MED ORDER — 0.9 % SODIUM CHLORIDE (POUR BTL) OPTIME
TOPICAL | Status: DC | PRN
  Administered 2013-05-04: 6000 mL

## 2013-05-04 MED ORDER — ONDANSETRON HCL 4 MG/2ML IJ SOLN: 4.0000 mg | Freq: Four times a day (QID) | INTRAMUSCULAR | Status: DC | PRN

## 2013-05-04 MED ORDER — PROTAMINE SULFATE 10 MG/ML IV SOLN
INTRAVENOUS | Status: DC | PRN
  Administered 2013-05-04: 140 mg via INTRAVENOUS
  Administered 2013-05-04: 150 mg via INTRAVENOUS
  Administered 2013-05-04: 10 mg via INTRAVENOUS

## 2013-05-04 MED ORDER — NITROPRUSSIDE SODIUM 25 MG/ML IV SOLN
0.2500 ug/kg/min | INTRAVENOUS | Status: DC
  Filled 2013-05-04: qty 2

## 2013-05-04 MED ORDER — VECURONIUM BROMIDE 10 MG IV SOLR
INTRAVENOUS | Status: DC | PRN
  Administered 2013-05-04: 10 mg via INTRAVENOUS

## 2013-05-04 MED ORDER — MIDAZOLAM HCL 5 MG/5ML IJ SOLN
INTRAMUSCULAR | Status: DC | PRN
  Administered 2013-05-04 (×2): 3 mg via INTRAVENOUS
  Administered 2013-05-04 (×3): 2 mg via INTRAVENOUS
  Administered 2013-05-04 (×2): 3 mg via INTRAVENOUS
  Administered 2013-05-04: 2 mg via INTRAVENOUS

## 2013-05-04 MED ORDER — MILRINONE IN DEXTROSE 20 MG/100ML IV SOLN
0.1250 ug/kg/min | INTRAVENOUS | Status: DC
  Filled 2013-05-04: qty 100

## 2013-05-04 MED ORDER — METOPROLOL TARTRATE 1 MG/ML IV SOLN
2.5000 mg | INTRAVENOUS | Status: DC | PRN
  Administered 2013-05-05 (×2): 5 mg via INTRAVENOUS
  Filled 2013-05-04: qty 5

## 2013-05-04 MED ORDER — LACTATED RINGERS IV SOLN: INTRAVENOUS | Status: DC

## 2013-05-04 MED ORDER — SODIUM CHLORIDE 0.45 % IV SOLN
INTRAVENOUS | Status: DC
  Administered 2013-05-04: 14:00:00 via INTRAVENOUS

## 2013-05-04 MED ORDER — BISACODYL 10 MG RE SUPP: 10.0000 mg | Freq: Every day | RECTAL | Status: DC

## 2013-05-04 MED ORDER — MAGNESIUM SULFATE 40 MG/ML IJ SOLN
4.0000 g | Freq: Once | INTRAMUSCULAR | Status: AC
Start: 2013-05-04 — End: 2013-05-04
  Administered 2013-05-04: 4 g via INTRAVENOUS
  Filled 2013-05-04: qty 100

## 2013-05-04 MED ORDER — LEVALBUTEROL HCL 0.63 MG/3ML IN NEBU
1.2500 mg | INHALATION_SOLUTION | Freq: Three times a day (TID) | RESPIRATORY_TRACT | Status: DC
  Administered 2013-05-05 (×3): 1.25 mg via RESPIRATORY_TRACT
  Filled 2013-05-04 (×4): qty 6
  Filled 2013-05-04: qty 3
  Filled 2013-05-04 (×6): qty 6

## 2013-05-04 MED ORDER — PHENYLEPHRINE HCL 10 MG/ML IJ SOLN
0.0000 ug/min | INTRAVENOUS | Status: DC
  Filled 2013-05-04: qty 2

## 2013-05-04 MED ORDER — LIDOCAINE HCL (CARDIAC) 20 MG/ML IV SOLN
INTRAVENOUS | Status: DC | PRN
  Administered 2013-05-04: 100 mg via INTRAVENOUS

## 2013-05-04 MED ORDER — ACETAMINOPHEN 160 MG/5ML PO SOLN: 975.0000 mg | Freq: Four times a day (QID) | ORAL | Status: DC

## 2013-05-04 MED ORDER — EPHEDRINE SULFATE 50 MG/ML IJ SOLN
INTRAMUSCULAR | Status: DC | PRN
  Administered 2013-05-04: 10 mg via INTRAVENOUS

## 2013-05-04 MED ORDER — LACTATED RINGERS IV SOLN
INTRAVENOUS | Status: DC
  Administered 2013-05-04: 19:00:00 via INTRAVENOUS

## 2013-05-04 MED ORDER — MORPHINE SULFATE 2 MG/ML IJ SOLN
2.0000 mg | INTRAMUSCULAR | Status: DC | PRN
  Administered 2013-05-05 (×2): 4 mg via INTRAVENOUS
  Administered 2013-05-07 (×2): 2 mg via INTRAVENOUS
  Administered 2013-05-09 – 2013-05-10 (×2): 4 mg via INTRAVENOUS
  Filled 2013-05-04 (×4): qty 2
  Filled 2013-05-04: qty 3
  Filled 2013-05-04 (×2): qty 2
  Filled 2013-05-04: qty 1

## 2013-05-04 MED ORDER — HEMOSTATIC AGENTS (NO CHARGE) OPTIME
TOPICAL | Status: DC | PRN
  Administered 2013-05-04: 1 via TOPICAL

## 2013-05-04 MED ORDER — ACETAMINOPHEN 10 MG/ML IV SOLN
1000.0000 mg | Freq: Once | INTRAVENOUS | Status: AC
Start: 2013-05-04 — End: 2013-05-04
  Administered 2013-05-04: 1000 mg via INTRAVENOUS
  Filled 2013-05-04: qty 100

## 2013-05-04 MED ORDER — MIDAZOLAM HCL 2 MG/2ML IJ SOLN: 2.0000 mg | INTRAMUSCULAR | Status: DC | PRN

## 2013-05-04 MED ORDER — BISACODYL 5 MG PO TBEC
10.0000 mg | DELAYED_RELEASE_TABLET | Freq: Every day | ORAL | Status: DC
  Administered 2013-05-05 – 2013-05-08 (×3): 10 mg via ORAL
  Filled 2013-05-04 (×4): qty 2

## 2013-05-04 MED ORDER — DOCUSATE SODIUM 100 MG PO CAPS
200.0000 mg | ORAL_CAPSULE | Freq: Every day | ORAL | Status: DC
  Administered 2013-05-05 – 2013-05-10 (×4): 200 mg via ORAL
  Filled 2013-05-04 (×5): qty 2

## 2013-05-04 MED ORDER — SODIUM CHLORIDE 0.9 % IJ SOLN
OROMUCOSAL | Status: DC | PRN
  Administered 2013-05-04 (×3): via TOPICAL

## 2013-05-04 MED ORDER — INSULIN REGULAR BOLUS VIA INFUSION
0.0000 [IU] | Freq: Three times a day (TID) | INTRAVENOUS | Status: DC
  Filled 2013-05-04: qty 10

## 2013-05-04 MED ORDER — MORPHINE SULFATE 2 MG/ML IJ SOLN
1.0000 mg | INTRAMUSCULAR | Status: AC | PRN
  Administered 2013-05-04: 4 mg via INTRAVENOUS
  Administered 2013-05-04: 2 mg via INTRAVENOUS
  Filled 2013-05-04: qty 1

## 2013-05-04 MED ORDER — ASPIRIN EC 325 MG PO TBEC
325.0000 mg | DELAYED_RELEASE_TABLET | Freq: Every day | ORAL | Status: DC
  Administered 2013-05-05 – 2013-05-10 (×6): 325 mg via ORAL
  Filled 2013-05-04 (×6): qty 1

## 2013-05-04 MED ORDER — METOCLOPRAMIDE HCL 5 MG/ML IJ SOLN
10.0000 mg | Freq: Four times a day (QID) | INTRAMUSCULAR | Status: AC
  Administered 2013-05-04 – 2013-05-05 (×4): 10 mg via INTRAVENOUS
  Filled 2013-05-04 (×4): qty 2

## 2013-05-04 MED ORDER — NITROGLYCERIN IN D5W 200-5 MCG/ML-% IV SOLN: 0.0000 ug/min | INTRAVENOUS | Status: DC

## 2013-05-04 MED ORDER — DEXTROSE 5 % IV SOLN
1.5000 g | Freq: Two times a day (BID) | INTRAVENOUS | Status: AC
  Administered 2013-05-05 – 2013-05-06 (×4): 1.5 g via INTRAVENOUS
  Filled 2013-05-04 (×4): qty 1.5

## 2013-05-04 MED ORDER — ASPIRIN 81 MG PO CHEW: 324.0000 mg | CHEWABLE_TABLET | Freq: Every day | ORAL | Status: DC

## 2013-05-04 MED ORDER — DEXMEDETOMIDINE HCL IN NACL 200 MCG/50ML IV SOLN
0.1000 ug/kg/h | INTRAVENOUS | Status: DC
  Filled 2013-05-04: qty 50

## 2013-05-04 MED ORDER — POTASSIUM CHLORIDE 10 MEQ/50ML IV SOLN
10.0000 meq | Freq: Once | INTRAVENOUS | Status: AC
  Administered 2013-05-04: 10 meq via INTRAVENOUS

## 2013-05-04 SURGICAL SUPPLY — 115 items
ADAPTER CARDIO PERF ANTE/RETRO (ADAPTER) ×4 IMPLANT
ATTRACTOMAT 16X20 MAGNETIC DRP (DRAPES) ×4 IMPLANT
BAG DECANTER FOR FLEXI CONT (MISCELLANEOUS) ×4 IMPLANT
BANDAGE ELASTIC 4 VELCRO ST LF (GAUZE/BANDAGES/DRESSINGS) ×4 IMPLANT
BANDAGE ELASTIC 6 VELCRO ST LF (GAUZE/BANDAGES/DRESSINGS) ×4 IMPLANT
BANDAGE GAUZE ELAST BULKY 4 IN (GAUZE/BANDAGES/DRESSINGS) ×4 IMPLANT
BASKET HEART  (ORDER IN 25'S) (MISCELLANEOUS) ×1
BASKET HEART (ORDER IN 25'S) (MISCELLANEOUS) ×1
BASKET HEART (ORDER IN 25S) (MISCELLANEOUS) ×2 IMPLANT
BLADE STERNUM SYSTEM 6 (BLADE) ×4 IMPLANT
BLADE SURG 11 STRL SS (BLADE) ×4 IMPLANT
BLADE SURG 12 STRL SS (BLADE) ×4 IMPLANT
BLADE SURG ROTATE 9660 (MISCELLANEOUS) ×4 IMPLANT
CANISTER SUCTION 2500CC (MISCELLANEOUS) ×4 IMPLANT
CANNULA AORTIC HI-FLOW 6.5M20F (CANNULA) ×4 IMPLANT
CANNULA GUNDRY RCSP 15FR (MISCELLANEOUS) ×4 IMPLANT
CANNULA VENOUS LOW PROF 32X40 (CANNULA) ×4 IMPLANT
CANNULA VENOUS MAL SGL STG 40 (MISCELLANEOUS) IMPLANT
CANNULAE VENOUS MAL SGL STG 40 (MISCELLANEOUS)
CATH CPB KIT VANTRIGT (MISCELLANEOUS) ×4 IMPLANT
CATH ROBINSON RED A/P 18FR (CATHETERS) ×12 IMPLANT
CATH THORACIC 28FR (CATHETERS) IMPLANT
CATH THORACIC 28FR RT ANG (CATHETERS) IMPLANT
CATH THORACIC 36FR (CATHETERS) IMPLANT
CATH THORACIC 36FR RT ANG (CATHETERS) IMPLANT
CLIP FOGARTY SPRING 6M (CLIP) ×4 IMPLANT
CLIP RETRACTION 3.0MM CORONARY (MISCELLANEOUS) ×4 IMPLANT
CLIP TI WIDE RED SMALL 24 (CLIP) ×4 IMPLANT
CLOTH BEACON ORANGE TIMEOUT ST (SAFETY) ×4 IMPLANT
COVER SURGICAL LIGHT HANDLE (MISCELLANEOUS) ×4 IMPLANT
CRADLE DONUT ADULT HEAD (MISCELLANEOUS) ×4 IMPLANT
DERMABOND ADVANCED (GAUZE/BANDAGES/DRESSINGS) ×2
DERMABOND ADVANCED .7 DNX12 (GAUZE/BANDAGES/DRESSINGS) ×2 IMPLANT
DRAIN CHANNEL 32F RND 10.7 FF (WOUND CARE) ×4 IMPLANT
DRAPE CARDIOVASCULAR INCISE (DRAPES) ×2
DRAPE SLUSH MACHINE 52X66 (DRAPES) IMPLANT
DRAPE SLUSH/WARMER DISC (DRAPES) ×4 IMPLANT
DRAPE SRG 135X102X78XABS (DRAPES) ×2 IMPLANT
DRSG COVADERM 4X14 (GAUZE/BANDAGES/DRESSINGS) ×4 IMPLANT
ELECT BLADE 4.0 EZ CLEAN MEGAD (MISCELLANEOUS) ×4
ELECT BLADE 6.5 EXT (BLADE) ×4 IMPLANT
ELECT CAUTERY BLADE 6.4 (BLADE) ×4 IMPLANT
ELECT REM PT RETURN 9FT ADLT (ELECTROSURGICAL) ×8
ELECTRODE BLDE 4.0 EZ CLN MEGD (MISCELLANEOUS) ×2 IMPLANT
ELECTRODE REM PT RTRN 9FT ADLT (ELECTROSURGICAL) ×4 IMPLANT
GLOVE BIO SURGEON STRL SZ 6.5 (GLOVE) ×15 IMPLANT
GLOVE BIO SURGEON STRL SZ7.5 (GLOVE) ×8 IMPLANT
GLOVE BIO SURGEONS STRL SZ 6.5 (GLOVE) ×5
GLOVE BIOGEL PI IND STRL 6.5 (GLOVE) ×4 IMPLANT
GLOVE BIOGEL PI IND STRL 7.0 (GLOVE) ×8 IMPLANT
GLOVE BIOGEL PI INDICATOR 6.5 (GLOVE) ×4
GLOVE BIOGEL PI INDICATOR 7.0 (GLOVE) ×8
GOWN EXTRA PROTECTION XL (GOWNS) ×4 IMPLANT
GOWN STRL NON-REIN LRG LVL3 (GOWN DISPOSABLE) ×16 IMPLANT
HEMOSTAT POWDER SURGIFOAM 1G (HEMOSTASIS) ×12 IMPLANT
HEMOSTAT SURGICEL 2X14 (HEMOSTASIS) ×4 IMPLANT
INSERT FOGARTY XLG (MISCELLANEOUS) IMPLANT
KIT BASIN OR (CUSTOM PROCEDURE TRAY) ×4 IMPLANT
KIT ROOM TURNOVER OR (KITS) ×4 IMPLANT
KIT SUCTION CATH 14FR (SUCTIONS) ×4 IMPLANT
KIT VASOVIEW W/TROCAR VH 2000 (KITS) ×4 IMPLANT
LEAD PACING MYOCARDI (MISCELLANEOUS) ×4 IMPLANT
MARKER GRAFT CORONARY BYPASS (MISCELLANEOUS) ×12 IMPLANT
NS IRRIG 1000ML POUR BTL (IV SOLUTION) ×24 IMPLANT
PACK OPEN HEART (CUSTOM PROCEDURE TRAY) ×4 IMPLANT
PAD ARMBOARD 7.5X6 YLW CONV (MISCELLANEOUS) ×8 IMPLANT
PAD DEFIB R2 (MISCELLANEOUS) ×4 IMPLANT
PENCIL BUTTON HOLSTER BLD 10FT (ELECTRODE) ×4 IMPLANT
PUNCH AORTIC ROTATE 4.0MM (MISCELLANEOUS) ×4 IMPLANT
PUNCH AORTIC ROTATE 4.5MM 8IN (MISCELLANEOUS) IMPLANT
PUNCH AORTIC ROTATE 5MM 8IN (MISCELLANEOUS) IMPLANT
SET CARDIOPLEGIA MPS 5001102 (MISCELLANEOUS) ×4 IMPLANT
SPONGE GAUZE 4X4 12PLY (GAUZE/BANDAGES/DRESSINGS) ×8 IMPLANT
SUT BONE WAX W31G (SUTURE) ×4 IMPLANT
SUT MNCRL AB 4-0 PS2 18 (SUTURE) ×4 IMPLANT
SUT PROLENE 3 0 SH DA (SUTURE) IMPLANT
SUT PROLENE 3 0 SH1 36 (SUTURE) IMPLANT
SUT PROLENE 4 0 RB 1 (SUTURE) ×6
SUT PROLENE 4 0 SH DA (SUTURE) ×4 IMPLANT
SUT PROLENE 4-0 RB1 .5 CRCL 36 (SUTURE) ×6 IMPLANT
SUT PROLENE 5 0 C 1 36 (SUTURE) ×4 IMPLANT
SUT PROLENE 6 0 C 1 30 (SUTURE) ×8 IMPLANT
SUT PROLENE 6 0 CC (SUTURE) ×12 IMPLANT
SUT PROLENE 7 0 DA (SUTURE) IMPLANT
SUT PROLENE 7.0 RB 3 (SUTURE) ×20 IMPLANT
SUT PROLENE 8 0 BV175 6 (SUTURE) IMPLANT
SUT PROLENE BLUE 7 0 (SUTURE) ×8 IMPLANT
SUT PROLENE POLY MONO (SUTURE) ×8 IMPLANT
SUT SILK  1 MH (SUTURE)
SUT SILK 1 MH (SUTURE) IMPLANT
SUT SILK 1 TIES 10X30 (SUTURE) ×8 IMPLANT
SUT SILK 2 0 SH CR/8 (SUTURE) IMPLANT
SUT SILK 3 0 SH CR/8 (SUTURE) IMPLANT
SUT STEEL 6MS V (SUTURE) ×8 IMPLANT
SUT STEEL STERNAL CCS#1 18IN (SUTURE) IMPLANT
SUT STEEL SZ 6 DBL 3X14 BALL (SUTURE) ×4 IMPLANT
SUT VIC AB 1 CTX 36 (SUTURE) ×4
SUT VIC AB 1 CTX36XBRD ANBCTR (SUTURE) ×4 IMPLANT
SUT VIC AB 2-0 CT1 27 (SUTURE) ×2
SUT VIC AB 2-0 CT1 TAPERPNT 27 (SUTURE) ×2 IMPLANT
SUT VIC AB 2-0 CTX 27 (SUTURE) IMPLANT
SUT VIC AB 3-0 SH 27 (SUTURE)
SUT VIC AB 3-0 SH 27X BRD (SUTURE) IMPLANT
SUT VIC AB 3-0 X1 27 (SUTURE) IMPLANT
SUT VICRYL 4-0 PS2 18IN ABS (SUTURE) IMPLANT
SUTURE E-PAK OPEN HEART (SUTURE) ×4 IMPLANT
SYSTEM SAHARA CHEST DRAIN ATS (WOUND CARE) ×8 IMPLANT
TAPE CLOTH SURG 4X10 WHT LF (GAUZE/BANDAGES/DRESSINGS) ×4 IMPLANT
TAPE PAPER 3X10 WHT MICROPORE (GAUZE/BANDAGES/DRESSINGS) ×4 IMPLANT
TOWEL OR 17X24 6PK STRL BLUE (TOWEL DISPOSABLE) ×8 IMPLANT
TOWEL OR 17X26 10 PK STRL BLUE (TOWEL DISPOSABLE) ×8 IMPLANT
TRAY FOLEY IC TEMP SENS 14FR (CATHETERS) ×4 IMPLANT
TUBING INSUFFLATION 10FT LAP (TUBING) ×4 IMPLANT
UNDERPAD 30X30 INCONTINENT (UNDERPADS AND DIAPERS) ×4 IMPLANT
WATER STERILE IRR 1000ML POUR (IV SOLUTION) ×8 IMPLANT

## 2013-05-04 NOTE — Anesthesia Procedure Notes (Signed)
Procedure Name: Intubation Date/Time: 05/04/2013 7:43 AM Performed by: Orvilla Fus A Pre-anesthesia Checklist: Patient identified, Timeout performed, Emergency Drugs available, Suction available and Patient being monitored Patient Re-evaluated:Patient Re-evaluated prior to inductionOxygen Delivery Method: Circle system utilized Preoxygenation: Pre-oxygenation with 100% oxygen Intubation Type: IV induction Ventilation: Mask ventilation without difficulty and Oral airway inserted - appropriate to patient size Laryngoscope Size: Mac and 4 Grade View: Grade I Tube type: Oral Tube size: 8.0 mm Number of attempts: 1 Airway Equipment and Method: Stylet Placement Confirmation: ETT inserted through vocal cords under direct vision,  breath sounds checked- equal and bilateral and positive ETCO2 Secured at: 23 cm Tube secured with: Tape Dental Injury: Dental damage  Comments: Patient's extremely loose front left tooth dislodged during mask ventilation. Tooth secured with suture before intubation. Tooth placed in bag with chart. No bleeding at the site.

## 2013-05-04 NOTE — Progress Notes (Signed)
Patient ID: Mark Carpenter, male   DOB: January 12, 1956, 57 y.o.   MRN: [id number]   SICU Evening Rounds:  Hemodynamically stable  Extubated and alert  Urine output good  CT output low  CBC    Component Value Date/Time   WBC 13.8* 05/04/2013 1400   RBC 3.69* 05/04/2013 1400   HGB 11.2* 05/04/2013 1404   HCT 33.0* 05/04/2013 1404   PLT 135* 05/04/2013 1400   MCV 87.8 05/04/2013 1400   MCH 30.6 05/04/2013 1400   MCHC 34.9 05/04/2013 1400   RDW 12.9 05/04/2013 1400    BMET    Component Value Date/Time   NA 137 05/04/2013 1404   K 3.8 05/04/2013 1404   CL 101 05/03/2013 2324   CO2 29 05/03/2013 2324   GLUCOSE 101* 05/04/2013 1404   BUN 27* 05/03/2013 2324   CREATININE 1.36* 05/03/2013 2324   CALCIUM 9.5 05/03/2013 2324   GFRNONAA 56* 05/03/2013 2324   GFRAA 65* 05/03/2013 2324    Stable postop course.

## 2013-05-04 NOTE — Progress Notes (Signed)
Pt is awake and can follow commands 

## 2013-05-04 NOTE — Progress Notes (Signed)
The patient was examined and preop studies reviewed. There has been no change from the prior exam and the patient is ready for surgery.  Plan CABG on [address] [place] today

## 2013-05-04 NOTE — Preoperative (Signed)
Beta Blockers   Reason not to administer Beta Blockers:Not Applicable 

## 2013-05-04 NOTE — Anesthesia Preprocedure Evaluation (Addendum)
Anesthesia Evaluation  Patient identified by MRN, date of birth, ID band Patient awake    Reviewed: Allergy & Precautions, H&P , NPO status , Patient's Chart, lab work & pertinent test results  Airway Mallampati: II  Neck ROM: full    Dental  (+) Loose and Dental Advisory Given,    Pulmonary Current Smoker,          Cardiovascular hypertension, + angina + CAD     Neuro/Psych    GI/Hepatic GERD-  ,  Endo/Other    Renal/GU      Musculoskeletal   Abdominal   Peds  Hematology   Anesthesia Other Findings   Reproductive/Obstetrics                          Anesthesia Physical Anesthesia Plan  ASA: III  Anesthesia Plan: General   Post-op Pain Management:    Induction: Intravenous  Airway Management Planned: Oral ETT  Additional Equipment: Arterial line, CVP, PA Cath, TEE and Ultrasound Guidance Line Placement  Intra-op Plan:   Post-operative Plan: Post-operative intubation/ventilation  Informed Consent: I have reviewed the patients History and Physical, chart, labs and discussed the procedure including the risks, benefits and alternatives for the proposed anesthesia with the patient or authorized representative who has indicated his/her understanding and acceptance.     Plan Discussed with: CRNA, Anesthesiologist and Surgeon  Anesthesia Plan Comments:         Anesthesia Quick Evaluation

## 2013-05-04 NOTE — OR Nursing (Signed)
45 minute call made to 2300 at 1232; spoke with Lynnea Ferrier.  20 minute call made to 2300 at 1311; spoke with Lynnea Ferrier.

## 2013-05-04 NOTE — Progress Notes (Signed)
  Echocardiogram Echocardiogram Transesophageal has been performed.  Mark Carpenter 05/04/2013, 10:57 AM

## 2013-05-04 NOTE — Transfer of Care (Signed)
Immediate Anesthesia Transfer of Care Note  Patient: Mark Carpenter  Procedure(s) Performed: Procedure(s): CORONARY ARTERY BYPASS GRAFTING times five using Right Greater Saphenous Vein Graft harvested endoscopically and Left Internal Mammary Artery (N/A) INTRAOPERATIVE TRANSESOPHAGEAL ECHOCARDIOGRAM (N/A)  Patient Location: SICU  Anesthesia Type:General  Level of Consciousness: Patient remains intubated per anesthesia plan  Airway & Oxygen Therapy: Patient remains intubated per anesthesia plan and Patient placed on Ventilator (see vital sign flow sheet for setting)  Post-op Assessment: Report given to PACU RN and Post -op Vital signs reviewed and stable  Post vital signs: Reviewed and stable  Complications: No apparent anesthesia complications

## 2013-05-04 NOTE — Plan of Care (Signed)
Problem: Consults Goal: Cardiac Surgery Patient Education ( See Patient Education module for education specifics.)  Outcome: Progressing Pt has all teaching materials in room.  Reviewed some details earlier this shift.  Mark Carpenter P

## 2013-05-04 NOTE — Brief Op Note (Signed)
04/28/2013 - 05/04/2013  11:35 AM  PATIENT:  Mark Carpenter  57 y.o. male  PRE-OPERATIVE DIAGNOSIS:  Coronary Artery Disease  POST-OPERATIVE DIAGNOSIS:  Coronary Artery Disease  PROCEDURE:  INTRAOPERATIVE TRANSESOPHAGEAL ECHOCARDIOGRAM, MEDIAN STERNOTOMY for CORONARY ARTERY BYPASS GRAFTING (CABG) x 5 (LIMA to LAD, SVG to RAMUS INTERMEDIATE, SVG SEQUENTIALLY TO DISTAL CIRCUMFLEX AND OM1, and SVG TO RCA) with EVH from the right thigh and partial lower leg   SURGEON:  Surgeon(s) and Role:    * Kerin Perna, MD - Primary  PHYSICIAN ASSISTANT: Doree Fudge PA-C   ANESTHESIA:   general  EBL:  Total I/O In: 2200 [I.V.:2200] Out: 440 [Urine:440]   DRAINS: Chest Tube(s) in the Mediastinal and pleural spaces    COUNTS CORRECT:  YES  DICTATION: .Dragon Dictation  PLAN OF CARE: Admit to inpatient   PATIENT DISPOSITION:  ICU - intubated and hemodynamically stable.   Delay start of Pharmacological VTE agent (>24hrs) due to surgical blood loss or risk of bleeding: yes  PRE OP WEIGHT: 82 kg

## 2013-05-04 NOTE — Procedures (Signed)
Extubation Procedure Note  Patient Details:   Name: Mark Carpenter DOB: Feb 17, 1956 MRN: [id number]   Airway Documentation:     Evaluation  O2 sats: stable throughout Complications: No apparent complications Patient did tolerate procedure well. Bilateral Breath Sounds: Clear;Diminished   Yes pt able to vocalize.  Pt extubated at this time per Rapid Wean Protocol to 4L [place] and tolerated well. NIF -20cm H2O, VC 0.7L. IS x5. Pt able to breathe around deflated cuff. No complications noted. No stridor noted. VS stable. Pt has strong, adequate cough. RT will continue to monitor.    Loyal Jacobson [facility] 05/04/2013, 6:20 PM

## 2013-05-05 ENCOUNTER — Other Ambulatory Visit: Payer: Self-pay

## 2013-05-05 ENCOUNTER — Encounter (HOSPITAL_COMMUNITY): Payer: Self-pay | Admitting: Cardiothoracic Surgery

## 2013-05-05 ENCOUNTER — Inpatient Hospital Stay (HOSPITAL_COMMUNITY): Payer: MEDICAID

## 2013-05-05 DIAGNOSIS — E8770 Fluid overload, unspecified: Secondary | ICD-10-CM

## 2013-05-05 DIAGNOSIS — E8779 Other fluid overload: Secondary | ICD-10-CM

## 2013-05-05 LAB — BASIC METABOLIC PANEL
BUN: 21 mg/dL (ref 6–23)
Chloride: 105 mEq/L (ref 96–112)
Creatinine, Ser: 1.09 mg/dL (ref 0.50–1.35)
GFR calc Af Amer: 85 mL/min — ABNORMAL LOW (ref >90)
Glucose, Bld: 136 mg/dL — ABNORMAL HIGH (ref 70–99)
Potassium: 4.2 mEq/L (ref 3.5–5.1)

## 2013-05-05 LAB — CBC
HCT: 33.1 % — ABNORMAL LOW (ref 39.0–52.0)
Hemoglobin: 11.4 g/dL — ABNORMAL LOW (ref 13.0–17.0)
Hemoglobin: 11 g/dL — ABNORMAL LOW (ref 13.0–17.0)
MCH: 30.7 pg (ref 26.0–34.0)
MCHC: 34.4 g/dL (ref 30.0–36.0)
MCV: 89.2 fL (ref 78.0–100.0)
MCV: 89.1 fL (ref 78.0–100.0)
Platelets: 141 10*3/uL — ABNORMAL LOW (ref 150–400)
RBC: 3.58 MIL/uL — ABNORMAL LOW (ref 4.22–5.81)
RDW: 13.4 % (ref 11.5–15.5)
WBC: 17.5 10*3/uL — ABNORMAL HIGH (ref 4.0–10.5)
WBC: 16.3 10*3/uL — ABNORMAL HIGH (ref 4.0–10.5)

## 2013-05-05 LAB — PREPARE PLATELET PHERESIS: Unit division: 0

## 2013-05-05 LAB — CREATININE, SERUM: GFR calc Af Amer: 76 mL/min — ABNORMAL LOW (ref >90)

## 2013-05-05 LAB — POCT I-STAT, CHEM 8
Chloride: 102 mEq/L (ref 96–112)
Creatinine, Ser: 1.2 mg/dL (ref 0.50–1.35)
Glucose, Bld: 148 mg/dL — ABNORMAL HIGH (ref 70–99)
Hemoglobin: 10.5 g/dL — ABNORMAL LOW (ref 13.0–17.0)
Potassium: 3.9 mEq/L (ref 3.5–5.1)

## 2013-05-05 LAB — GLUCOSE, CAPILLARY: Glucose-Capillary: 119 mg/dL — ABNORMAL HIGH (ref 70–99)

## 2013-05-05 MED ORDER — KETOROLAC TROMETHAMINE 15 MG/ML IJ SOLN
INTRAMUSCULAR | Status: AC
  Administered 2013-05-05: 15 mg via INTRAVENOUS
  Filled 2013-05-05: qty 1

## 2013-05-05 MED ORDER — KETOROLAC TROMETHAMINE 15 MG/ML IJ SOLN
15.0000 mg | Freq: Four times a day (QID) | INTRAMUSCULAR | Status: AC
  Administered 2013-05-05 – 2013-05-06 (×2): 15 mg via INTRAVENOUS
  Filled 2013-05-05 (×3): qty 1

## 2013-05-05 MED ORDER — KETOROLAC TROMETHAMINE 15 MG/ML IJ SOLN
15.0000 mg | Freq: Once | INTRAMUSCULAR | Status: AC
  Administered 2013-05-05: 15 mg via INTRAVENOUS

## 2013-05-05 MED ORDER — POTASSIUM CHLORIDE CRYS ER 20 MEQ PO TBCR
20.0000 meq | EXTENDED_RELEASE_TABLET | Freq: Once | ORAL | Status: AC
  Administered 2013-05-05: 20 meq via ORAL
  Filled 2013-05-05: qty 1

## 2013-05-05 MED ORDER — INSULIN ASPART 100 UNIT/ML ~~LOC~~ SOLN
0.0000 [IU] | SUBCUTANEOUS | Status: DC
  Administered 2013-05-05 – 2013-05-06 (×3): 2 [IU] via SUBCUTANEOUS

## 2013-05-05 MED ORDER — TRAMADOL HCL 50 MG PO TABS
100.0000 mg | ORAL_TABLET | Freq: Four times a day (QID) | ORAL | Status: DC | PRN
  Administered 2013-05-06 (×2): 100 mg via ORAL
  Administered 2013-05-06: 50 mg via ORAL
  Administered 2013-05-07 – 2013-05-10 (×3): 100 mg via ORAL
  Filled 2013-05-05 (×4): qty 2
  Filled 2013-05-05: qty 1
  Filled 2013-05-05: qty 2

## 2013-05-05 MED ORDER — FUROSEMIDE 10 MG/ML IJ SOLN
40.0000 mg | Freq: Two times a day (BID) | INTRAMUSCULAR | Status: AC
  Administered 2013-05-05: 40 mg via INTRAVENOUS
  Filled 2013-05-05: qty 4

## 2013-05-05 MED ORDER — LISINOPRIL 10 MG PO TABS
10.0000 mg | ORAL_TABLET | Freq: Every day | ORAL | Status: DC
  Administered 2013-05-05 – 2013-05-06 (×2): 10 mg via ORAL
  Filled 2013-05-05 (×3): qty 1

## 2013-05-05 MED ORDER — FUROSEMIDE 10 MG/ML IJ SOLN: 20.0000 mg | Freq: Two times a day (BID) | INTRAMUSCULAR | Status: DC

## 2013-05-05 MED FILL — Lidocaine HCl IV Inj 20 MG/ML: INTRAVENOUS | Qty: 5 | Status: AC

## 2013-05-05 MED FILL — Heparin Sodium (Porcine) Inj 1000 Unit/ML: INTRAMUSCULAR | Qty: 10 | Status: AC

## 2013-05-05 MED FILL — Potassium Chloride Inj 2 mEq/ML: INTRAVENOUS | Qty: 40 | Status: AC

## 2013-05-05 MED FILL — Mannitol IV Soln 20%: INTRAVENOUS | Qty: 500 | Status: AC

## 2013-05-05 MED FILL — Sodium Chloride IV Soln 0.9%: INTRAVENOUS | Qty: 1000 | Status: AC

## 2013-05-05 MED FILL — Sodium Chloride IV Soln 0.9%: INTRAVENOUS | Qty: 3000 | Status: AC

## 2013-05-05 MED FILL — Sodium Bicarbonate IV Soln 8.4%: INTRAVENOUS | Qty: 50 | Status: AC

## 2013-05-05 MED FILL — Electrolyte-R (PH 7.4) Solution: INTRAVENOUS | Qty: 4000 | Status: AC

## 2013-05-05 MED FILL — Magnesium Sulfate Inj 50%: INTRAMUSCULAR | Qty: 10 | Status: AC

## 2013-05-05 MED FILL — Heparin Sodium (Porcine) Inj 1000 Unit/ML: INTRAMUSCULAR | Qty: 30 | Status: AC

## 2013-05-05 NOTE — Progress Notes (Signed)
Patient ID: Mark Carpenter, male   DOB: 05-03-1956, 57 y.o.   MRN: [id number]                   [address]            [name] [id number]          [phone]     1 Day Post-Op Procedure(s) (LRB): CORONARY ARTERY BYPASS GRAFTING times five using Right Greater Saphenous Vein Graft harvested endoscopically and Left Internal Mammary Artery (N/A) INTRAOPERATIVE TRANSESOPHAGEAL ECHOCARDIOGRAM (N/A)  Total Length of Stay:  LOS: 7 days  BP 96/55  Pulse 79  Temp(Src) 98.6 F (37 C) (Oral)  Resp 18  Ht 5\' 11"  (1.803 m)  Wt 197 lb 5 oz (89.5 kg)  BMI 27.53 kg/m2  SpO2 95%  .Intake/Output     05/13 0701 - 05/14 0700   P.O. 700   I.V. (mL/kg) 240 (2.7)   Blood    NG/GT    IV Piggyback 250   Total Intake(mL/kg) 1190 (13.3)   Urine (mL/kg/hr) 990 (0.8)   Blood    Chest Tube 140 (0.1)   Total Output 1130   Net +60         . sodium chloride 20 mL/hr at 05/05/13 0800  . sodium chloride    . nitroGLYCERIN Stopped (05/04/13 1600)  . nitroPRUSSide    . phenylephrine (NEO-SYNEPHRINE) Adult infusion Stopped (05/04/13 1400)     Lab Results  Component Value Date   WBC 16.3* 05/05/2013   HGB 11.0* 05/05/2013   HCT 31.9* 05/05/2013   PLT 141* 05/05/2013   GLUCOSE 148* 05/05/2013   CHOL 192 04/29/2013   TRIG 234* 04/29/2013   HDL 32* 04/29/2013   LDLCALC 113* 04/29/2013   ALT 41 05/03/2013   AST 33 05/03/2013   NA 137 05/05/2013   K 3.9 05/05/2013   CL 102 05/05/2013   CREATININE 1.20 05/05/2013   BUN 22 05/05/2013   CO2 24 05/05/2013   TSH 3.465 04/28/2013   INR 1.34 05/04/2013   HGBA1C 5.4 04/28/2013   Stable day  Delight Ovens MD  Beeper [phone] Office [phone] 05/05/2013 8:33 PM

## 2013-05-05 NOTE — Progress Notes (Addendum)
TCTS DAILY PROGRESS NOTE                   [address]            [name] [address]          [phone]      1 Day Post-Op Procedure(s) (LRB): CORONARY ARTERY BYPASS GRAFTING times five using Right Greater Saphenous Vein Graft harvested endoscopically and Left Internal Mammary Artery (N/A) INTRAOPERATIVE TRANSESOPHAGEAL ECHOCARDIOGRAM (N/A)  Total Length of Stay:  LOS: 7 days   Subjective: Patient with complaints of being sleepy this am and incisional pain.  Objective: Vital signs in last 24 hours: Temp:  [97 F (36.1 C)-99.5 F (37.5 C)] 98.2 F (36.8 C) (05/13 0700) Pulse Rate:  [68-88] 70 (05/13 0700) Cardiac Rhythm:  [-] Normal sinus rhythm (05/12 2000) Resp:  [11-26] 19 (05/13 0700) BP: (90-131)/(48-75) 95/58 mmHg (05/13 0700) SpO2:  [90 %-100 %] 93 % (05/13 0700) Arterial Line BP: (96-184)/(48-65) 111/52 mmHg (05/13 0700) FiO2 (%):  [40 %-50 %] 40 % (05/12 1800) Weight:  [89.5 kg (197 lb 5 oz)] 89.5 kg (197 lb 5 oz) (05/13 0600)  Filed Weights   05/03/13 0400 05/04/13 0500 05/05/13 0600  Weight: 82.101 kg (181 lb) 82.101 kg (181 lb) 89.5 kg (197 lb 5 oz)    Weight change: 7.399 kg (16 lb 5 oz)   Hemodynamic parameters for last 24 hours: PAP: (25-72)/(13-28) 47/24 mmHg CO:  [4 L/min-7.4 L/min] 6.8 L/min CI:  [2 L/min/m2-3.6 L/min/m2] 3.4 L/min/m2  Intake/Output from previous day: 05/12 0701 - 05/13 0700 In: 5818 [P.O.:150; I.V.:3693; Blood:945; NG/GT:30; IV Piggyback:1000] Out: 4385 [Urine:2745; Blood:1150; Chest Tube:490]      Current Meds: Scheduled Meds: . acetaminophen  1,000 mg Oral Q6H   Or  . acetaminophen (TYLENOL) oral liquid 160 mg/5 mL  975 mg Per Tube Q6H  . aspirin EC  325 mg Oral Daily   Or  . aspirin  324 mg Per Tube Daily  . atorvastatin  40 mg Oral q1800  . bisacodyl  10 mg Oral Daily   Or  . bisacodyl  10 mg Rectal Daily  . budesonide-formoterol  2 puff Inhalation BID  . cefUROXime (ZINACEF)  IV  1.5 g  Intravenous Q12H  . docusate sodium  200 mg Oral Daily  . insulin aspart  0-24 Units Subcutaneous Q4H  . ketorolac      . levalbuterol  1.25 mg Nebulization TID  . metoCLOPramide (REGLAN) injection  10 mg Intravenous Q6H  . metoprolol tartrate  12.5 mg Oral BID   Or  . metoprolol tartrate  12.5 mg Per Tube BID  . nicotine  21 mg Transdermal Daily  . [START ON 05/06/2013] pantoprazole  40 mg Oral Daily  . sodium chloride  3 mL Intravenous Q12H  . vancomycin  1,000 mg Intravenous Q12H   Continuous Infusions: . sodium chloride 20 mL/hr at 05/05/13 0700  . sodium chloride 10 mL/hr at 05/04/13 1400  . sodium chloride    . dexmedetomidine Stopped (05/04/13 1800)  . DOPamine Stopped (05/04/13 1400)  . lactated ringers 20 mL/hr at 05/05/13 0700  . nitroGLYCERIN Stopped (05/04/13 1600)  . nitroPRUSSide    . phenylephrine (NEO-SYNEPHRINE) Adult infusion Stopped (05/04/13 1400)   PRN Meds:.albumin human, metoprolol, morphine injection, ondansetron (ZOFRAN) IV, oxyCODONE, sodium chloride  Neurologic: intact Heart: RRR, rub with chest tubes in place, no murmur Lungs: Slightly diminished at bases;no rales, wheezes, or rhonchi Abdomen: soft, non-tender; bowel  sounds normal; no masses,  no organomegaly Extremities: Mild LE edema R>L Wound: Dressings clean and dry  Lab Results: CBC: Recent Labs  05/04/13 2000 05/04/13 2053 05/05/13 0500  WBC 16.7*  --  17.5*  HGB 11.2* 10.9* 11.4*  HCT 31.9* 32.0* 33.1*  PLT 151  --  171   BMET:  Recent Labs  05/03/13 2324  05/04/13 2053 05/05/13 0500  NA 138  < > 138 138  K 3.9  < > 4.2 4.2  CL 101  --  106 105  CO2 29  --   --  24  GLUCOSE 130*  < > 152* 136*  BUN 27*  --  19 21  CREATININE 1.36*  < > 1.10 1.09  CALCIUM 9.5  --   --  8.1*  < > = values in this interval not displayed.  PT/INR:  Recent Labs  05/04/13 1400  LABPROT 16.3*  INR 1.34   Radiology: Dg Chest Portable 1 View  05/04/2013  *RADIOLOGY REPORT*  Clinical Data:  Postop CABG.  PORTABLE CHEST - 1 VIEW  Comparison: 05/03/2013  Findings: Endotracheal tube tip is 6.38 cm above the base of the carina. The NG tube passes into the stomach although the distal tip position is not included on the film.Two left chest tubes noted without evidence for pneumothorax.  Right IJ pulmonary artery catheter tip is in the main pulmonary artery.  A midline mediastinal cyst pericardial drain noted.  Subsegmental atelectasis noted in the lung bases.  No substantial pleural effusion. The cardiopericardial silhouette is within normal limits for size. Telemetry leads overlie the chest.  IMPRESSION: Bibasilar atelectasis in this patient immediately status post CABG.   Original Report Authenticated By: Kennith Center, M.D.      Assessment/Plan: S/P Procedure(s) (LRB): CORONARY ARTERY BYPASS GRAFTING times five using Right Greater Saphenous Vein Graft harvested endoscopically and Left Internal Mammary Artery (N/A) INTRAOPERATIVE TRANSESOPHAGEAL ECHOCARDIOGRAM (N/A)  1.CV-PVCs. On Nitro at 4 this am. Wean as tolerates. Also, on Lopressor 12.5 bid 2.Pulmonary- Chest tubes with 490 cc since surgery. CXR this am appears to show no pneumothorax, bibasilar atelectasis, and small bilateral pleural effusions. 3.Volume overload-Begin gentle diuresis 4.ABL anemia- H and H stable at 11.4 and 33.1 5. Right carotid artery stenosis-will need follow up as outpatient with vascular surgery 6.Regarding pain control, receiving a fair amount of Morphine as well as Oxy IR. Will add ultram PRN. May need to consider more Toradol. 7.CBGs 127/147/119. Pre op HGA1C 5.4. Will need further surveillance as an outpatient 8.Please see progression orders   ZIMMERMAN,DONIELLE M PA-C 05/05/2013 7:36 AM  Add lisinopril for elevated BP, slow HR IV lasix 40mg  ordered

## 2013-05-05 NOTE — Progress Notes (Signed)
MD called to make aware of sub-Q air on left side of patients chest extending into left side of neck. MD ordered portable chest X-ray. Pt not complaining of any difficulty breathing, O2 sats stable in low to mid 90s on 2 L [place], which is where pt has been since O2 was weaned down. Will cont to assess and monitor pt. Information was passed on in report for following nurse to monitor pt as well

## 2013-05-05 NOTE — Anesthesia Postprocedure Evaluation (Signed)
  Anesthesia Post-op Note  Patient: Mark Carpenter  Procedure(s) Performed: Procedure(s): CORONARY ARTERY BYPASS GRAFTING times five using Right Greater Saphenous Vein Graft harvested endoscopically and Left Internal Mammary Artery (N/A) INTRAOPERATIVE TRANSESOPHAGEAL ECHOCARDIOGRAM (N/A)  Patient Location: ICU  Anesthesia Type:General  Level of Consciousness: awake, alert , oriented and patient cooperative  Airway and Oxygen Therapy: Patient Spontanous Breathing and Patient connected to nasal cannula oxygen  Post-op Pain: mild  Post-op Assessment: Post-op Vital signs reviewed, Patient's Cardiovascular Status Stable, Respiratory Function Stable, Patent Airway, No signs of Nausea or vomiting, Pain level controlled, No headache, No backache, No residual numbness and No residual motor weakness  Post-op Vital Signs: Reviewed and stable , on NTG gtt with SBP 160-180.  Complications: No apparent anesthesia complications

## 2013-05-05 NOTE — Progress Notes (Signed)
Patient ID: Mark Carpenter, male   DOB: 07/31/1956, 57 y.o.   MRN: [id number]  SUBJECTIVE:  No chest pain.  No SOB.     PHYSICAL EXAM Filed Vitals:   05/05/13 0400 05/05/13 0500 05/05/13 0600 05/05/13 0700  BP: 122/66 117/61 92/75 95/58   Pulse: 74 72 84 70  Temp: 99.3 F (37.4 C) 99.3 F (37.4 C) 99.5 F (37.5 C) 98.2 F (36.8 C)  TempSrc:      Resp: 22 20 26 19   Height:      Weight:   197 lb 5 oz (89.5 kg)   SpO2: 96% 93% 91% 93%   General:  No distress Neck: supple Lungs:  Diminished BS Heart:  RRR Abdomen:  Positive bowel sounds, no rebound no guarding Extremities:  Trace edema  LABS: Lab Results  Component Value Date   TROPONINI <0.30 04/29/2013   Results for orders placed during the hospital encounter of 04/28/13 (from the past 24 hour(s))  POCT I-STAT 4, (NA,K, GLUC, HGB,HCT)     Status: Abnormal   Collection Time    05/04/13  7:53 AM      Result Value Range   Sodium 140  135 - 145 mEq/L   Potassium 4.0  3.5 - 5.1 mEq/L   Glucose, Bld 113 (*) 70 - 99 mg/dL   HCT 40.9  81.1 - 91.4 %   Hemoglobin 13.3  13.0 - 17.0 g/dL  POCT I-STAT 4, (NA,K, GLUC, HGB,HCT)     Status: Abnormal   Collection Time    05/04/13  9:28 AM      Result Value Range   Sodium 138  135 - 145 mEq/L   Potassium 4.2  3.5 - 5.1 mEq/L   Glucose, Bld 112 (*) 70 - 99 mg/dL   HCT 78.2 (*) 95.6 - 21.3 %   Hemoglobin 11.2 (*) 13.0 - 17.0 g/dL  POCT I-STAT 3, BLOOD GAS (G3+)     Status: Abnormal   Collection Time    05/04/13  9:47 AM      Result Value Range   pH, Arterial 7.348 (*) 7.350 - 7.450   pCO2 arterial 46.1 (*) 35.0 - 45.0 mmHg   pO2, Arterial 327.0 (*) 80.0 - 100.0 mmHg   Bicarbonate 25.3 (*) 20.0 - 24.0 mEq/L   TCO2 27  0 - 100 mmol/L   O2 Saturation 100.0     Sample type ARTERIAL    POCT I-STAT 4, (NA,K, GLUC, HGB,HCT)     Status: Abnormal   Collection Time    05/04/13  9:50 AM      Result Value Range   Sodium 134 (*) 135 - 145 mEq/L   Potassium 4.3  3.5 - 5.1 mEq/L   Glucose,  Bld 102 (*) 70 - 99 mg/dL   HCT 08.6 (*) 57.8 - 46.9 %   Hemoglobin 9.2 (*) 13.0 - 17.0 g/dL  PLATELET COUNT     Status: Abnormal   Collection Time    05/04/13 11:21 AM      Result Value Range   Platelets 110 (*) 150 - 400 K/uL  HEMOGLOBIN AND HEMATOCRIT, BLOOD     Status: Abnormal   Collection Time    05/04/13 11:21 AM      Result Value Range   Hemoglobin 8.5 (*) 13.0 - 17.0 g/dL   HCT 62.9 (*) 52.8 - 41.3 %  POCT I-STAT 4, (NA,K, GLUC, HGB,HCT)     Status: Abnormal   Collection Time    05/04/13 11:25 AM  Result Value Range   Sodium 129 (*) 135 - 145 mEq/L   Potassium 4.8  3.5 - 5.1 mEq/L   Glucose, Bld 117 (*) 70 - 99 mg/dL   HCT 16.1 (*) 09.6 - 04.5 %   Hemoglobin 8.2 (*) 13.0 - 17.0 g/dL  PREPARE PLATELET PHERESIS     Status: None   Collection Time    05/04/13 11:35 AM      Result Value Range   Unit Number [id number]     Blood Component Type PLTPHER LR2     Unit division 00     Status of Unit ISSUED     Transfusion Status OK TO TRANSFUSE    POCT I-STAT 3, BLOOD GAS (G3+)     Status: Abnormal   Collection Time    05/04/13 12:36 PM      Result Value Range   pH, Arterial 7.295 (*) 7.350 - 7.450   pCO2 arterial 46.2 (*) 35.0 - 45.0 mmHg   pO2, Arterial 113.0 (*) 80.0 - 100.0 mmHg   Bicarbonate 22.5  20.0 - 24.0 mEq/L   TCO2 24  0 - 100 mmol/L   O2 Saturation 98.0     Acid-base deficit 4.0 (*) 0.0 - 2.0 mmol/L   Sample type ARTERIAL    POCT I-STAT 4, (NA,K, GLUC, HGB,HCT)     Status: Abnormal   Collection Time    05/04/13 12:39 PM      Result Value Range   Sodium 133 (*) 135 - 145 mEq/L   Potassium 4.3  3.5 - 5.1 mEq/L   Glucose, Bld 140 (*) 70 - 99 mg/dL   HCT 56.2 (*) 13.0 - 86.5 %   Hemoglobin 9.2 (*) 13.0 - 17.0 g/dL  CBC     Status: Abnormal   Collection Time    05/04/13  2:00 PM      Result Value Range   WBC 13.8 (*) 4.0 - 10.5 K/uL   RBC 3.69 (*) 4.22 - 5.81 MIL/uL   Hemoglobin 11.3 (*) 13.0 - 17.0 g/dL   HCT 78.4 (*) 69.6 - 29.5 %   MCV 87.8   78.0 - 100.0 fL   MCH 30.6  26.0 - 34.0 pg   MCHC 34.9  30.0 - 36.0 g/dL   RDW 28.4  13.2 - 44.0 %   Platelets 135 (*) 150 - 400 K/uL  PROTIME-INR     Status: Abnormal   Collection Time    05/04/13  2:00 PM      Result Value Range   Prothrombin Time 16.3 (*) 11.6 - 15.2 seconds   INR 1.34  0.00 - 1.49  APTT     Status: Abnormal   Collection Time    05/04/13  2:00 PM      Result Value Range   aPTT 40 (*) 24 - 37 seconds  POCT I-STAT 4, (NA,K, GLUC, HGB,HCT)     Status: Abnormal   Collection Time    05/04/13  2:04 PM      Result Value Range   Sodium 137  135 - 145 mEq/L   Potassium 3.8  3.5 - 5.1 mEq/L   Glucose, Bld 101 (*) 70 - 99 mg/dL   HCT 10.2 (*) 72.5 - 36.6 %   Hemoglobin 11.2 (*) 13.0 - 17.0 g/dL  POCT I-STAT 3, BLOOD GAS (G3+)     Status: Abnormal   Collection Time    05/04/13  2:07 PM      Result Value Range   pH, Arterial  7.309 (*) 7.350 - 7.450   pCO2 arterial 46.4 (*) 35.0 - 45.0 mmHg   pO2, Arterial 99.0  80.0 - 100.0 mmHg   Bicarbonate 23.4  20.0 - 24.0 mEq/L   TCO2 25  0 - 100 mmol/L   O2 Saturation 97.0     Acid-base deficit 3.0 (*) 0.0 - 2.0 mmol/L   Patient temperature 36.4 C     Sample type ARTERIAL    GLUCOSE, CAPILLARY     Status: None   Collection Time    05/04/13  2:50 PM      Result Value Range   Glucose-Capillary 94  70 - 99 mg/dL  GLUCOSE, CAPILLARY     Status: Abnormal   Collection Time    05/04/13  3:54 PM      Result Value Range   Glucose-Capillary 101 (*) 70 - 99 mg/dL  GLUCOSE, CAPILLARY     Status: Abnormal   Collection Time    05/04/13  5:55 PM      Result Value Range   Glucose-Capillary 127 (*) 70 - 99 mg/dL  POCT I-STAT 3, BLOOD GAS (G3+)     Status: Abnormal   Collection Time    05/04/13  5:58 PM      Result Value Range   pH, Arterial 7.327 (*) 7.350 - 7.450   pCO2 arterial 40.5  35.0 - 45.0 mmHg   pO2, Arterial 76.0 (*) 80.0 - 100.0 mmHg   Bicarbonate 21.2  20.0 - 24.0 mEq/L   TCO2 22  0 - 100 mmol/L   O2 Saturation  94.0     Acid-base deficit 4.0 (*) 0.0 - 2.0 mmol/L   Sample type ARTERIAL    POCT I-STAT 3, BLOOD GAS (G3+)     Status: Abnormal   Collection Time    05/04/13  6:57 PM      Result Value Range   pH, Arterial 7.324 (*) 7.350 - 7.450   pCO2 arterial 41.2  35.0 - 45.0 mmHg   pO2, Arterial 66.0 (*) 80.0 - 100.0 mmHg   Bicarbonate 21.4  20.0 - 24.0 mEq/L   TCO2 23  0 - 100 mmol/L   O2 Saturation 91.0     Acid-base deficit 4.0 (*) 0.0 - 2.0 mmol/L   Patient temperature 37.0 C     Collection site ARTERIAL LINE     Drawn by Nurse     Sample type ARTERIAL    MAGNESIUM     Status: Abnormal   Collection Time    05/04/13  8:00 PM      Result Value Range   Magnesium 2.7 (*) 1.5 - 2.5 mg/dL  CREATININE, SERUM     Status: Abnormal   Collection Time    05/04/13  8:00 PM      Result Value Range   Creatinine, Ser 1.15  0.50 - 1.35 mg/dL   GFR calc non Af Amer 69 (*) >90 mL/min   GFR calc Af Amer 80 (*) >90 mL/min  CBC     Status: Abnormal   Collection Time    05/04/13  8:00 PM      Result Value Range   WBC 16.7 (*) 4.0 - 10.5 K/uL   RBC 3.62 (*) 4.22 - 5.81 MIL/uL   Hemoglobin 11.2 (*) 13.0 - 17.0 g/dL   HCT 09.8 (*) 11.9 - 14.7 %   MCV 88.1  78.0 - 100.0 fL   MCH 30.9  26.0 - 34.0 pg   MCHC 35.1  30.0 - 36.0  g/dL   RDW 57.8  46.9 - 62.9 %   Platelets 151  150 - 400 K/uL  GLUCOSE, CAPILLARY     Status: Abnormal   Collection Time    05/04/13  8:51 PM      Result Value Range   Glucose-Capillary 147 (*) 70 - 99 mg/dL  POCT I-STAT, CHEM 8     Status: Abnormal   Collection Time    05/04/13  8:53 PM      Result Value Range   Sodium 138  135 - 145 mEq/L   Potassium 4.2  3.5 - 5.1 mEq/L   Chloride 106  96 - 112 mEq/L   BUN 19  6 - 23 mg/dL   Creatinine, Ser 5.28  0.50 - 1.35 mg/dL   Glucose, Bld 413 (*) 70 - 99 mg/dL   Calcium, Ion 2.44  0.10 - 1.23 mmol/L   TCO2 22  0 - 100 mmol/L   Hemoglobin 10.9 (*) 13.0 - 17.0 g/dL   HCT 27.2 (*) 53.6 - 64.4 %  CBC     Status: Abnormal    Collection Time    05/05/13  5:00 AM      Result Value Range   WBC 17.5 (*) 4.0 - 10.5 K/uL   RBC 3.71 (*) 4.22 - 5.81 MIL/uL   Hemoglobin 11.4 (*) 13.0 - 17.0 g/dL   HCT 03.4 (*) 74.2 - 59.5 %   MCV 89.2  78.0 - 100.0 fL   MCH 30.7  26.0 - 34.0 pg   MCHC 34.4  30.0 - 36.0 g/dL   RDW 63.8  75.6 - 43.3 %   Platelets 171  150 - 400 K/uL  BASIC METABOLIC PANEL     Status: Abnormal   Collection Time    05/05/13  5:00 AM      Result Value Range   Sodium 138  135 - 145 mEq/L   Potassium 4.2  3.5 - 5.1 mEq/L   Chloride 105  96 - 112 mEq/L   CO2 24  19 - 32 mEq/L   Glucose, Bld 136 (*) 70 - 99 mg/dL   BUN 21  6 - 23 mg/dL   Creatinine, Ser 2.95  0.50 - 1.35 mg/dL   Calcium 8.1 (*) 8.4 - 10.5 mg/dL   GFR calc non Af Amer 74 (*) >90 mL/min   GFR calc Af Amer 85 (*) >90 mL/min  MAGNESIUM     Status: None   Collection Time    05/05/13  5:00 AM      Result Value Range   Magnesium 2.3  1.5 - 2.5 mg/dL    Intake/Output Summary (Last 24 hours) at 05/05/13 0714 Last data filed at 05/05/13 0600  Gross per 24 hour  Intake   5818 ml  Output   4385 ml  Net   1433 ml     ASSESSMENT AND PLAN:  CAD:  Doing well s/p CABG. Continue ASA, statin and lopressor.  CAROTID STENOSIS:  Severe ICA disease.  Will need fu carotid dopplers with vascular surgery in six months.  TOBACCO:  The patient is committed to not smoking.  HTN (hypertension):  Follow and adjust regimen as needed.  Postoperative volume excess: diurese    Daleiza Bacchi,M.D. 05/05/2013 7:14 AM

## 2013-05-06 ENCOUNTER — Inpatient Hospital Stay (HOSPITAL_COMMUNITY): Payer: MEDICAID

## 2013-05-06 LAB — GLUCOSE, CAPILLARY
Glucose-Capillary: 121 mg/dL — ABNORMAL HIGH (ref 70–99)
Glucose-Capillary: 136 mg/dL — ABNORMAL HIGH (ref 70–99)

## 2013-05-06 LAB — CBC
HCT: 30 % — ABNORMAL LOW (ref 39.0–52.0)
MCH: 30.6 pg (ref 26.0–34.0)
MCHC: 34.3 g/dL (ref 30.0–36.0)
MCV: 89 fL (ref 78.0–100.0)
RDW: 13.2 % (ref 11.5–15.5)

## 2013-05-06 LAB — BASIC METABOLIC PANEL
BUN: 28 mg/dL — ABNORMAL HIGH (ref 6–23)
CO2: 27 mEq/L (ref 19–32)
Chloride: 101 mEq/L (ref 96–112)
Creatinine, Ser: 1.09 mg/dL (ref 0.50–1.35)

## 2013-05-06 MED ORDER — FUROSEMIDE 10 MG/ML IJ SOLN: 40.0000 mg | Freq: Two times a day (BID) | INTRAMUSCULAR | Status: DC

## 2013-05-06 MED ORDER — FUROSEMIDE 40 MG PO TABS
40.0000 mg | ORAL_TABLET | Freq: Every day | ORAL | Status: DC
  Filled 2013-05-06: qty 1

## 2013-05-06 MED ORDER — LEVALBUTEROL HCL 1.25 MG/0.5ML IN NEBU
1.2500 mg | INHALATION_SOLUTION | Freq: Three times a day (TID) | RESPIRATORY_TRACT | Status: DC
  Administered 2013-05-06 – 2013-05-08 (×7): 1.25 mg via RESPIRATORY_TRACT
  Filled 2013-05-06 (×10): qty 0.5

## 2013-05-06 MED ORDER — FUROSEMIDE 10 MG/ML IJ SOLN
40.0000 mg | Freq: Every day | INTRAMUSCULAR | Status: DC
  Administered 2013-05-06 – 2013-05-07 (×2): 40 mg via INTRAVENOUS
  Filled 2013-05-06 (×2): qty 4

## 2013-05-06 MED ORDER — POTASSIUM CHLORIDE CRYS ER 20 MEQ PO TBCR
20.0000 meq | EXTENDED_RELEASE_TABLET | Freq: Every day | ORAL | Status: DC
  Administered 2013-05-06 – 2013-05-10 (×5): 20 meq via ORAL
  Filled 2013-05-06 (×5): qty 1

## 2013-05-06 MED ORDER — INSULIN ASPART 100 UNIT/ML ~~LOC~~ SOLN
0.0000 [IU] | Freq: Four times a day (QID) | SUBCUTANEOUS | Status: DC
  Administered 2013-05-06 (×2): 2 [IU] via SUBCUTANEOUS

## 2013-05-06 NOTE — Progress Notes (Signed)
TCTS BRIEF SICU PROGRESS NOTE  2 Days Post-Op  S/P Procedure(s) (LRB): CORONARY ARTERY BYPASS GRAFTING times five using Right Greater Saphenous Vein Graft harvested endoscopically and Left Internal Mammary Artery (N/A) INTRAOPERATIVE TRANSESOPHAGEAL ECHOCARDIOGRAM (N/A)   Stable day Developed some subQ air in face R periorbital region w/ coughin NSR w/ stable BP UOP adequate  Plan: Continue current plan  Kyle Stansell H 05/06/2013 6:41 PM

## 2013-05-06 NOTE — Progress Notes (Addendum)
TCTS DAILY PROGRESS NOTE                   [address]            [name] [address]          [phone]      2 Days Post-Op Procedure(s) (LRB): CORONARY ARTERY BYPASS GRAFTING times five using Right Greater Saphenous Vein Graft harvested endoscopically and Left Internal Mammary Artery (N/A) INTRAOPERATIVE TRANSESOPHAGEAL ECHOCARDIOGRAM (N/A)  Total Length of Stay:  LOS: 8 days   Subjective: Patient with complaints of pain at chest tube site.  Objective: Vital signs in last 24 hours: Temp:  [97.7 F (36.5 C)-99 F (37.2 C)] 98.7 F (37.1 C) (05/14 0745) Pulse Rate:  [61-89] 89 (05/14 0700) Cardiac Rhythm:  [-] Normal sinus rhythm (05/13 2200) Resp:  [10-31] 31 (05/14 0700) BP: (92-154)/(39-75) 135/66 mmHg (05/14 0600) SpO2:  [88 %-100 %] 96 % (05/14 0700) Arterial Line BP: (122-181)/(48-59) 181/59 mmHg (05/13 1015) Weight:  [88.724 kg (195 lb 9.6 oz)] 88.724 kg (195 lb 9.6 oz) (05/14 0500)  Filed Weights   05/04/13 0500 05/05/13 0600 05/06/13 0500  Weight: 82.101 kg (181 lb) 89.5 kg (197 lb 5 oz) 88.724 kg (195 lb 9.6 oz)    Weight change: -0.777 kg (-1 lb 11.4 oz)   Hemodynamic parameters for last 24 hours: PAP: (37-63)/(15-25) 63/24 mmHg CO:  [5 L/min] 5 L/min CI:  [2.4 L/min/m2-2.5 L/min/m2] 2.5 L/min/m2  Intake/Output from previous day: 05/13 0701 - 05/14 0700 In: 1510 [P.O.:750; I.V.:460; IV Piggyback:300] Out: 1475 [Urine:1335; Chest Tube:140]      Current Meds: Scheduled Meds: . acetaminophen  1,000 mg Oral Q6H   Or  . acetaminophen (TYLENOL) oral liquid 160 mg/5 mL  975 mg Per Tube Q6H  . aspirin EC  325 mg Oral Daily   Or  . aspirin  324 mg Per Tube Daily  . atorvastatin  40 mg Oral q1800  . bisacodyl  10 mg Oral Daily   Or  . bisacodyl  10 mg Rectal Daily  . budesonide-formoterol  2 puff Inhalation BID  . cefUROXime (ZINACEF)  IV  1.5 g Intravenous Q12H  . docusate sodium  200 mg Oral Daily  . insulin aspart  0-24 Units  Subcutaneous Q4H  . levalbuterol  1.25 mg Nebulization TID  . lisinopril  10 mg Oral Daily  . metoprolol tartrate  12.5 mg Oral BID  . nicotine  21 mg Transdermal Daily  . pantoprazole  40 mg Oral Daily  . sodium chloride  3 mL Intravenous Q12H   Continuous Infusions: . sodium chloride 20 mL/hr at 05/05/13 0800  . sodium chloride    . nitroGLYCERIN Stopped (05/04/13 1600)  . nitroPRUSSide    . phenylephrine (NEO-SYNEPHRINE) Adult infusion Stopped (05/04/13 1400)   PRN Meds:.metoprolol, morphine injection, ondansetron (ZOFRAN) IV, oxyCODONE, sodium chloride, traMADol  Neurologic: intact Heart: RRR, rub with chest tubes in place, no murmur Lungs: Slightly diminished at bases;no rales, wheezes, or rhonchi. Minor subcutaneous emphysema base of left neck and upper chest. Abdomen: soft, non-tender; bowel sounds normal; no masses,  no organomegaly Extremities: Mild LE edema R>L Wound: Dressings clean and dry  Lab Results: CBC:  Recent Labs  05/05/13 1658 05/06/13 0440  WBC 16.3* 16.0*  HGB 11.0* 10.3*  HCT 31.9* 30.0*  PLT 141* 136*   BMET:   Recent Labs  05/05/13 0500 05/05/13 1654 05/05/13 1658 05/06/13 0440  NA 138 137  --  135  K 4.2 3.9  --  4.0  CL 105 102  --  101  CO2 24  --   --  27  GLUCOSE 136* 148*  --  134*  BUN 21 22  --  28*  CREATININE 1.09 1.20 1.20 1.09  CALCIUM 8.1*  --   --  8.2*    PT/INR:   Recent Labs  05/04/13 1400  LABPROT 16.3*  INR 1.34   Radiology: Dg Chest Port 1 View  05/05/2013   *RADIOLOGY REPORT*  Clinical Data: Subcu air.  PORTABLE CHEST - 1 VIEW  Comparison: 05/05/2013  Findings: Interval removal of one of the two left chest tubes. Tiny left apical pneumothorax.  Subcutaneous air noted bilaterally, increased since prior study.  Bibasilar atelectasis.  No visible significant effusion.  Heart is normal size.  IMPRESSION: Increasing subcutaneous air.  Interval removal of one of the two left chest tubes with tiny left apical  pneumothorax.   Original Report Authenticated By: Charlett Nose, M.D.   Dg Chest Portable 1 View In Am  05/05/2013   *RADIOLOGY REPORT*  Clinical Data: Status post CABG.  PORTABLE CHEST - 1 VIEW  Comparison: 05/04/2013  Findings: Endotracheal and nasogastric tubes have been removed. Left chest tubes remain in place without visualized pneumothorax. Swan-Ganz catheter tip is in the proximal right pulmonary artery. Lungs show slight increase in atelectasis at the right base.  No edema is identified.  Heart size and mediastinal contours are stable.  IMPRESSION: No pneumothorax.  Slightly increased right basilar atelectasis after extubation.   Original Report Authenticated By: Irish Lack, M.D.   Dg Chest Portable 1 View  05/04/2013   *RADIOLOGY REPORT*  Clinical Data: Postop CABG.  PORTABLE CHEST - 1 VIEW  Comparison: 05/03/2013  Findings: Endotracheal tube tip is 6.38 cm above the base of the carina. The NG tube passes into the stomach although the distal tip position is not included on the film.Two left chest tubes noted without evidence for pneumothorax.  Right IJ pulmonary artery catheter tip is in the main pulmonary artery.  A midline mediastinal cyst pericardial drain noted.  Subsegmental atelectasis noted in the lung bases.  No substantial pleural effusion. The cardiopericardial silhouette is within normal limits for size. Telemetry leads overlie the chest.  IMPRESSION: Bibasilar atelectasis in this patient immediately status post CABG.   Original Report Authenticated By: Kennith Center, M.D.     Assessment/Plan: S/P Procedure(s) (LRB): CORONARY ARTERY BYPASS GRAFTING times five using Right Greater Saphenous Vein Graft harvested endoscopically and Left Internal Mammary Artery (N/A) INTRAOPERATIVE TRANSESOPHAGEAL ECHOCARDIOGRAM (N/A)  1.CV-SR.  On Lopressor 12.5 bid and Lisinopril 10 daily. 2.Pulmonary- Chest tubes with 140 cc since surgery. there is a little tidling (but no bubbles) with cough.May  remove in am.CXR this am appears to show possible trace left apical  pneumothorax, bibasilar atelectasis, and small bilateral pleural effusions. 3.Volume overload-Continue diuresis 4.ABL anemia- H and H stable at 10.3 and 30 5. Right carotid artery stenosis-will need follow up as outpatient with vascular surgery 6.CBGs 151/119/128. Pre op HGA1C 5.4. Will need further surveillance as an outpatient 7.Mild thrombocytopenia-platelets 136,000 8.Transfer to PCTU  Ardelle Balls PA-C 05/06/2013 7:58 AM  Leave L chest tube to suction due to subQ air from emphysema

## 2013-05-06 NOTE — Op Note (Signed)
NAMETEVITA, GOMER NO.:  [id number]  MEDICAL RECORD NO.:  [id number]  LOCATION:  CATH                         FACILITY:  [facility]  PHYSICIAN:  Kerin Perna, M.D.  DATE OF BIRTH:  January 12, 1956  DATE OF PROCEDURE:  05/04/2013 DATE OF DISCHARGE:                              OPERATIVE REPORT   OPERATION: 1. Coronary artery bypass grafting x5 (left internal mammary artery to     left anterior descending, saphenous vein graft to ramus     intermediate, sequential saphenous vein graft to OM-1 and distal     circumflex, saphenous vein graft to posterior descending). 2. Endoscopic harvest of right leg greater saphenous vein.  SURGEON:  Kerin Perna, M.D.  ASSISTANT:  Doree Fudge, PA-C.  ANESTHESIA:  General.  INDICATIONS:  The patient is a 57 year old Caucasian male who presented with unstable angina and was found to have severe three-vessel coronary artery disease by cardiac catheterization.  LV function was mildly reduced.  He was felt to have a moderate left main stenosis approximately 70% and was recommended for surgical coronary revascularization.  Prior to surgery, I reviewed the results of cardiac catheterization with the patient and his family.  I discussed the indications and expected benefits of coronary artery bypass grafting for treatment of his coronary artery disease.  I reviewed with him the major aspects of the surgery including the use of general anesthesia and cardiopulmonary bypass, the location of the surgical incisions, and the expected postoperative hospital recovery.  I reviewed with the patient the risks to him of coronary artery bypass grafting including the potential for stroke, MI, bleeding, blood transfusion requirement, infection, and death.  He understood due to his active long-term smoking, he was at increased risk for postoperative pulmonary complications.  After reviewing these issues, he demonstrated his understanding  and agreed to proceed with surgery under what I felt was an informed consent.  OPERATIVE FINDINGS: 1. Adequate conduit, adequate targets. 2. Intraoperative mild coagulopathy treated with 1 unit of platelets.  OPERATIVE PROCEDURE:  The patient was brought to the operating room and placed supine on the operating room table where general anesthesia was induced.  A transesophageal echo probe was placed by the anesthesiologist.  The patient was prepped and draped as a sterile field.  A proper time-out was performed.  A sternal incision was made as the saphenous vein was harvested endoscopically from the right leg.  The left internal mammary artery was harvested as a pedicle graft from its origin at the subclavian vessels.  It was a good vessel with excellent flow.  A sternal retractor was placed and the pericardium was opened and suspended.  Pursestrings were placed in the ascending aorta and right atrium and heparin was administered.  The patient was cannulated and placed on cardiopulmonary bypass after the ACT was documented as being therapeutic.  The coronary arteries were identified for grafting.  The mammary artery and vein grafts were prepared for the distal anastomoses. Cardioplegia cannulas were placed for both antegrade aortic and retrograde coronary sinus cardioplegia.  The patient was cooled to 32 degrees and the aortic crossclamp was applied.  One liter of cold blood cardioplegia was  delivered in split doses between the antegrade aortic and retrograde coronary sinus catheters.  Myocardial temperature dropped less than 14 degrees.  Cardioplegia was delivered every 20 minutes or less.  The distal coronary anastomoses were then performed.  The first distal anastomosis was the posterior descending branch of the right coronary. It had a proximal 90% stenosis.  A reverse saphenous vein was sewn end- to-side with running 7-0 Prolene with good flow through the graft.  The 2nd and 3rd  distal anastomoses consisted of a sequential vein graft to the OM 1 continuing to the distal circumflex.  The OM 1 was a 1.5-mm vessel with proximal 100% occlusion.  A side-to-side anastomosis with the vein was sewn using running 7-0 Prolene.  The third distal anastomosis was the continuation of the sequential vein graft to the distal circumflex.  This was a 1.5-mm vessel with proximal 80% stenosis. The end of the vein was sewn end-to-side with running 7-0 Prolene with good flow through graft.  Cardioplegia was redosed.  The 4th distal anastomosis was the ramus intermediate branch of the left coronary.  It was a 1.5-mm vessel intramyocardial with proximal 70% stenosis.  A reverse saphenous vein was sewn end-to-side with running 7- 0 Prolene with good flow through the graft.  The 5th distal anastomosis was to the distal LAD.  It had proximal 90% stenosis.  The left IMA pedicle was brought through an opening created in the left lateral pericardium, and brought down to the LAD.  The LAD was a 1.5-mm vessel.  An end-to-side anastomosis with running 8-0 Prolene was constructed with good flow through the graft.  The bulldog was reapplied to the pedicle and the pedicle was secured to the epicardium with 6-0 Prolene.  Cardioplegia was redosed.  While the crossclamp was in place, 3 proximal anastomoses were performed on the ascending aorta with a 4.5 mm punch running 7-0 Prolene.  Prior to tying down the final proximal anastomosis, air was vented from the coronaries with a dose of retrograde warm blood cardioplegia.  The crossclamp was removed.  The heart resumed a spontaneous rhythm.  The grafts were de-aired and checked and each was hemostatic.  The patient was rewarmed to 37 degrees.  Temporary pacing wires were applied.  The lungs were expanded and ventilator was resumed.  The patient was then weaned from cardiopulmonary bypass without difficulty.  Hemodynamics remained stable.  The echo  showed good LV global function.  Protamine was administered without adverse reaction.  The cannulas were removed.  The mediastinum was irrigated with warm saline.  The superior pericardial fat was closed over the aorta.  Anterior mediastinal and a left pleural chest tube were placed and brought out through separate incisions.  The sternum was closed with wire.  The pectoralis fascia was closed in running #1 Vicryl.  The subcutaneous and skin layers were closed in running Vicryl and sterile dressings were applied.  Total cardiopulmonary bypass time was 150 minutes.     Kerin Perna, M.D.     PV/MEDQ  D:  05/05/2013  T:  05/06/2013  Job:  [id number]  cc:   Vesta Mixer, M.D.

## 2013-05-06 NOTE — Significant Event (Signed)
1110am-Called into room by patient. Pt verbalized feeling something blocking his right eye;  he thought it had been his  nasal cannula tube in his right eye. Upon assessment, patient had developed SQ air in the lower lid of right eye. This was not present prior. Pt stated he felt this way after coughing. RN spoke with Dr. Donata Clay to update him. MD gave telephone orders to leave patient in the ICU for close monitoring. Pt denied SOB or difficulty swallowing at this time. Patient's spouse updated over the phone. Will continue to monitor closely. Mark Carpenter, Charity fundraiser.

## 2013-05-07 ENCOUNTER — Inpatient Hospital Stay (HOSPITAL_COMMUNITY): Payer: MEDICAID

## 2013-05-07 DIAGNOSIS — J930 Spontaneous tension pneumothorax: Secondary | ICD-10-CM

## 2013-05-07 LAB — BASIC METABOLIC PANEL
BUN: 33 mg/dL — ABNORMAL HIGH (ref 6–23)
Calcium: 8.3 mg/dL — ABNORMAL LOW (ref 8.4–10.5)
Chloride: 101 mEq/L (ref 96–112)
Creatinine, Ser: 1.03 mg/dL (ref 0.50–1.35)
GFR calc Af Amer: >90 mL/min (ref >90)
GFR calc non Af Amer: 79 mL/min — ABNORMAL LOW (ref >90)

## 2013-05-07 LAB — CBC
HCT: 28.3 % — ABNORMAL LOW (ref 39.0–52.0)
MCH: 31.3 pg (ref 26.0–34.0)
MCHC: 34.6 g/dL (ref 30.0–36.0)
MCV: 90.4 fL (ref 78.0–100.0)
Platelets: 124 10*3/uL — ABNORMAL LOW (ref 150–400)
RDW: 13.6 % (ref 11.5–15.5)
WBC: 12.8 10*3/uL — ABNORMAL HIGH (ref 4.0–10.5)

## 2013-05-07 LAB — GLUCOSE, CAPILLARY
Glucose-Capillary: 118 mg/dL — ABNORMAL HIGH (ref 70–99)
Glucose-Capillary: 126 mg/dL — ABNORMAL HIGH (ref 70–99)
Glucose-Capillary: 133 mg/dL — ABNORMAL HIGH (ref 70–99)
Glucose-Capillary: 107 mg/dL — ABNORMAL HIGH (ref 70–99)
Glucose-Capillary: 106 mg/dL — ABNORMAL HIGH (ref 70–99)

## 2013-05-07 MED ORDER — INSULIN ASPART 100 UNIT/ML ~~LOC~~ SOLN
0.0000 [IU] | Freq: Four times a day (QID) | SUBCUTANEOUS | Status: DC
  Administered 2013-05-07: 2 [IU] via SUBCUTANEOUS

## 2013-05-07 MED ORDER — MIDAZOLAM HCL 2 MG/2ML IJ SOLN
1.0000 mg | Freq: Once | INTRAMUSCULAR | Status: AC
  Administered 2013-05-07: 1 mg via INTRAVENOUS

## 2013-05-07 MED ORDER — FENTANYL CITRATE 0.05 MG/ML IJ SOLN: 25.0000 ug | Freq: Two times a day (BID) | INTRAMUSCULAR | Status: DC | PRN

## 2013-05-07 MED ORDER — SODIUM CHLORIDE 0.9 % IJ SOLN: 3.0000 mL | INTRAMUSCULAR | Status: DC | PRN

## 2013-05-07 MED ORDER — SODIUM CHLORIDE 0.9 % IJ SOLN
3.0000 mL | Freq: Two times a day (BID) | INTRAMUSCULAR | Status: DC
  Administered 2013-05-07: 3 mL via INTRAVENOUS

## 2013-05-07 MED ORDER — MIDAZOLAM HCL 2 MG/2ML IJ SOLN
INTRAMUSCULAR | Status: AC
  Administered 2013-05-07: 1 mg via INTRAVENOUS
  Filled 2013-05-07: qty 4

## 2013-05-07 MED ORDER — LISINOPRIL 10 MG PO TABS
10.0000 mg | ORAL_TABLET | Freq: Every day | ORAL | Status: DC
  Administered 2013-05-07 – 2013-05-10 (×4): 10 mg via ORAL
  Filled 2013-05-07 (×4): qty 1

## 2013-05-07 MED ORDER — MOVING RIGHT ALONG BOOK
Freq: Once | Status: AC
  Administered 2013-05-07: 04:00:00
  Filled 2013-05-07: qty 1

## 2013-05-07 MED ORDER — MIDAZOLAM HCL 2 MG/2ML IJ SOLN: 1.0000 mg | Freq: Once | INTRAMUSCULAR | Status: AC

## 2013-05-07 MED ORDER — MORPHINE SULFATE 4 MG/ML IJ SOLN
4.0000 mg | Freq: Once | INTRAMUSCULAR | Status: AC
  Administered 2013-05-07: 4 mg via INTRAVENOUS
  Filled 2013-05-07: qty 1

## 2013-05-07 MED ORDER — SODIUM CHLORIDE 0.9 % IV SOLN: 250.0000 mL | INTRAVENOUS | Status: DC | PRN

## 2013-05-07 NOTE — Progress Notes (Signed)
Patient ID: Mark Carpenter, male   DOB: 05-Jan-1956, 57 y.o.   MRN: [id number]  SUBJECTIVE:  No chest pain.  No SOB.     PHYSICAL EXAM Filed Vitals:   05/07/13 0300 05/07/13 0400 05/07/13 0500 05/07/13 0600  BP: 102/62 106/62 104/65 107/63  Pulse: 74 72 71 74  Temp:  98.5 F (36.9 C)    TempSrc:  Oral    Resp: 9 11 11 15   Height:      Weight:   191 lb 9.3 oz (86.9 kg)   SpO2: 94% 94% 94% 95%   General:  No distress, sitting up in chair, feeling OK  Neck: supple Lungs:  Rhonchi bilaterally Heart:  RRR Abdomen:  Positive bowel sounds, no rebound no guarding Extremities:  Trace edema  LABS: Lab Results  Component Value Date   TROPONINI <0.30 04/29/2013   Results for orders placed during the hospital encounter of 04/28/13 (from the past 24 hour(s))  GLUCOSE, CAPILLARY     Status: Abnormal   Collection Time    05/06/13  7:47 AM      Result Value Range   Glucose-Capillary 108 (*) 70 - 99 mg/dL  GLUCOSE, CAPILLARY     Status: Abnormal   Collection Time    05/06/13 12:32 PM      Result Value Range   Glucose-Capillary 121 (*) 70 - 99 mg/dL  GLUCOSE, CAPILLARY     Status: Abnormal   Collection Time    05/06/13  5:27 PM      Result Value Range   Glucose-Capillary 136 (*) 70 - 99 mg/dL  GLUCOSE, CAPILLARY     Status: Abnormal   Collection Time    05/06/13  8:20 PM      Result Value Range   Glucose-Capillary 129 (*) 70 - 99 mg/dL  GLUCOSE, CAPILLARY     Status: Abnormal   Collection Time    05/07/13  1:38 AM      Result Value Range   Glucose-Capillary 108 (*) 70 - 99 mg/dL  CBC     Status: Abnormal   Collection Time    05/07/13  3:51 AM      Result Value Range   WBC 12.8 (*) 4.0 - 10.5 K/uL   RBC 3.13 (*) 4.22 - 5.81 MIL/uL   Hemoglobin 9.8 (*) 13.0 - 17.0 g/dL   HCT 08.6 (*) 57.8 - 46.9 %   MCV 90.4  78.0 - 100.0 fL   MCH 31.3  26.0 - 34.0 pg   MCHC 34.6  30.0 - 36.0 g/dL   RDW 62.9  52.8 - 41.3 %   Platelets 124 (*) 150 - 400 K/uL  BASIC METABOLIC PANEL      Status: Abnormal   Collection Time    05/07/13  3:51 AM      Result Value Range   Sodium 135  135 - 145 mEq/L   Potassium 4.2  3.5 - 5.1 mEq/L   Chloride 101  96 - 112 mEq/L   CO2 25  19 - 32 mEq/L   Glucose, Bld 117 (*) 70 - 99 mg/dL   BUN 33 (*) 6 - 23 mg/dL   Creatinine, Ser 2.44  0.50 - 1.35 mg/dL   Calcium 8.3 (*) 8.4 - 10.5 mg/dL   GFR calc non Af Amer 79 (*) >90 mL/min   GFR calc Af Amer >90  >90 mL/min  GLUCOSE, CAPILLARY     Status: Abnormal   Collection Time    05/07/13  5:45  AM      Result Value Range   Glucose-Capillary 133 (*) 70 - 99 mg/dL    Intake/Output Summary (Last 24 hours) at 05/07/13 0728 Last data filed at 05/07/13 0600  Gross per 24 hour  Intake    830 ml  Output   1485 ml  Net   -655 ml     ASSESSMENT AND PLAN:  CAD:  Doing well s/p CABG. Continue ASA, statin and lopressor.  CAROTID STENOSIS:  Severe ICA disease.  Will need fu carotid dopplers with vascular surgery in six months.  TOBACCO:  The patient is committed to not smoking.  HTN (hypertension):  BP is stable  Postoperative volume excess: diurese    Kristeen Miss Jr.,M.D. 05/07/2013 7:28 AM

## 2013-05-07 NOTE — Progress Notes (Signed)
Patient ID: Mark Carpenter, male   DOB: 1956-06-23, 57 y.o.   MRN: [id number]  SICU Evening Rounds:  Hemodynamically stable  Good diuresis today  No problems.

## 2013-05-07 NOTE — Progress Notes (Signed)
3 Days Post-Op Procedure(s) (LRB): CORONARY ARTERY BYPASS GRAFTING times five using Right Greater Saphenous Vein Graft harvested endoscopically and Left Internal Mammary Artery (N/A) INTRAOPERATIVE TRANSESOPHAGEAL ECHOCARDIOGRAM (N/A) Subjective: CABGx5- unstable angina COPD with postop pntx- mild on R, mild to mod on R, chest tube with air leak with cough on L  Objective: Vital signs in last 24 hours: Temp:  [97.5 F (36.4 C)-99.1 F (37.3 C)] 97.5 F (36.4 C) (05/15 0740) Pulse Rate:  [71-93] 74 (05/15 0600) Cardiac Rhythm:  [-] Normal sinus rhythm (05/15 0400) Resp:  [9-27] 15 (05/15 0600) BP: (102-147)/(50-65) 107/63 mmHg (05/15 0600) SpO2:  [90 %-98 %] 95 % (05/15 0600) Weight:  [191 lb 9.3 oz (86.9 kg)] 191 lb 9.3 oz (86.9 kg) (05/15 0500)  Hemonsrdynamic parameters for last 24 hours:  nsr  Intake/Output from previous day: 05/14 0701 - 05/15 0700 In: 830 [P.O.:720; I.V.:60; IV Piggyback:50] Out: 1485 [Urine:1285; Chest Tube:200] Intake/Output this shift:    EXAM subQ air is improved extrem warm  Lab Results:  Recent Labs  05/06/13 0440 05/07/13 0351  WBC 16.0* 12.8*  HGB 10.3* 9.8*  HCT 30.0* 28.3*  PLT 136* 124*   BMET:  Recent Labs  05/06/13 0440 05/07/13 0351  NA 135 135  K 4.0 4.2  CL 101 101  CO2 27 25  GLUCOSE 134* 117*  BUN 28* 33*  CREATININE 1.09 1.03  CALCIUM 8.2* 8.3*    PT/INR:  Recent Labs  05/04/13 1400  LABPROT 16.3*  INR 1.34   ABG    Component Value Date/Time   PHART 7.324* 05/04/2013 1857   HCO3 21.4 05/04/2013 1857   TCO2 24 05/05/2013 1654   ACIDBASEDEF 4.0* 05/04/2013 1857   O2SAT 91.0 05/04/2013 1857   CBG (last 3)   Recent Labs  05/06/13 2020 05/07/13 0138 05/07/13 0545  GLUCAP 129* 108* 133*    Assessment/Plan: S/P Procedure(s) (LRB): CORONARY ARTERY BYPASS GRAFTING times five using Right Greater Saphenous Vein Graft harvested endoscopically and Left Internal Mammary Artery (N/A) INTRAOPERATIVE  TRANSESOPHAGEAL ECHOCARDIOGRAM (N/A) Plan followup CXR noon- if R pntx bigger will place tube Keep in ICU   LOS: 9 days    VAN TRIGT III,PETER 05/07/2013

## 2013-05-07 NOTE — Progress Notes (Signed)
TCTS  Because of increased size of R PNTX a 68F chest tube was placed at the bedside under local 1% lidocaine CXR is pending

## 2013-05-08 ENCOUNTER — Inpatient Hospital Stay (HOSPITAL_COMMUNITY): Payer: MEDICAID

## 2013-05-08 LAB — CBC
HCT: 27.9 % — ABNORMAL LOW (ref 39.0–52.0)
Hemoglobin: 9.6 g/dL — ABNORMAL LOW (ref 13.0–17.0)
MCH: 30.9 pg (ref 26.0–34.0)
MCHC: 34.4 g/dL (ref 30.0–36.0)
MCV: 89.7 fL (ref 78.0–100.0)
Platelets: 143 10*3/uL — ABNORMAL LOW (ref 150–400)
RBC: 3.11 MIL/uL — ABNORMAL LOW (ref 4.22–5.81)
RDW: 13.4 % (ref 11.5–15.5)
WBC: 9.3 10*3/uL (ref 4.0–10.5)

## 2013-05-08 LAB — COMPREHENSIVE METABOLIC PANEL
ALT: 15 U/L (ref 0–53)
AST: 13 U/L (ref 0–37)
Albumin: 2.4 g/dL — ABNORMAL LOW (ref 3.5–5.2)
Alkaline Phosphatase: 124 U/L — ABNORMAL HIGH (ref 39–117)
BUN: 33 mg/dL — ABNORMAL HIGH (ref 6–23)
CO2: 28 mEq/L (ref 19–32)
Calcium: 8.6 mg/dL (ref 8.4–10.5)
Chloride: 97 mEq/L (ref 96–112)
Creatinine, Ser: 1.02 mg/dL (ref 0.50–1.35)
GFR calc Af Amer: >90 mL/min (ref >90)
GFR calc non Af Amer: 80 mL/min — ABNORMAL LOW (ref >90)
Glucose, Bld: 106 mg/dL — ABNORMAL HIGH (ref 70–99)
Potassium: 4.3 mEq/L (ref 3.5–5.1)
Sodium: 133 mEq/L — ABNORMAL LOW (ref 135–145)
Total Bilirubin: 0.4 mg/dL (ref 0.3–1.2)
Total Protein: 5.4 g/dL — ABNORMAL LOW (ref 6.0–8.3)

## 2013-05-08 LAB — GLUCOSE, CAPILLARY
Glucose-Capillary: 103 mg/dL — ABNORMAL HIGH (ref 70–99)
Glucose-Capillary: 114 mg/dL — ABNORMAL HIGH (ref 70–99)
Glucose-Capillary: 124 mg/dL — ABNORMAL HIGH (ref 70–99)

## 2013-05-08 MED ORDER — INSULIN ASPART 100 UNIT/ML ~~LOC~~ SOLN
0.0000 [IU] | Freq: Four times a day (QID) | SUBCUTANEOUS | Status: DC
  Administered 2013-05-08 (×2): 2 [IU] via SUBCUTANEOUS

## 2013-05-08 MED ORDER — SORBITOL 70 % SOLN
30.0000 mL | Freq: Every day | Status: DC | PRN
  Administered 2013-05-08: 30 mL via ORAL
  Filled 2013-05-08: qty 30

## 2013-05-08 MED ORDER — FUROSEMIDE 40 MG PO TABS
40.0000 mg | ORAL_TABLET | Freq: Every day | ORAL | Status: DC
  Administered 2013-05-08 – 2013-05-10 (×3): 40 mg via ORAL
  Filled 2013-05-08 (×2): qty 1

## 2013-05-08 MED ORDER — LEVALBUTEROL HCL 1.25 MG/0.5ML IN NEBU
1.2500 mg | INHALATION_SOLUTION | Freq: Two times a day (BID) | RESPIRATORY_TRACT | Status: DC
  Administered 2013-05-08 – 2013-05-10 (×4): 1.25 mg via RESPIRATORY_TRACT
  Filled 2013-05-08 (×6): qty 0.5

## 2013-05-08 MED ORDER — POTASSIUM CHLORIDE CRYS ER 20 MEQ PO TBCR
20.0000 meq | EXTENDED_RELEASE_TABLET | Freq: Every day | ORAL | Status: DC
  Administered 2013-05-09 – 2013-05-10 (×2): 20 meq via ORAL
  Filled 2013-05-08 (×2): qty 1

## 2013-05-08 MED ORDER — FE FUMARATE-B12-VIT C-FA-IFC PO CAPS
1.0000 | ORAL_CAPSULE | Freq: Three times a day (TID) | ORAL | Status: DC
  Administered 2013-05-08 – 2013-05-10 (×6): 1 via ORAL
  Filled 2013-05-08 (×9): qty 1

## 2013-05-08 NOTE — Progress Notes (Addendum)
TCTS DAILY PROGRESS NOTE                   [address]            [place] [id number]          [phone]      4 Days Post-Op Procedure(s) (LRB): CORONARY ARTERY BYPASS GRAFTING times five using Right Greater Saphenous Vein Graft harvested endoscopically and Left Internal Mammary Artery (N/A) INTRAOPERATIVE TRANSESOPHAGEAL ECHOCARDIOGRAM (N/A)  Total Length of Stay:  LOS: 10 days   Subjective: Patient comfortable with bilateral chest tubes for COPD, postop sub Q air , bilat PNTX NSR CXR today- no PNTX Objective: Vital signs in last 24 hours: Temp:  [98.1 F (36.7 C)-98.8 F (37.1 C)] 98.1 F (36.7 C) (05/16 0747) Pulse Rate:  [66-104] 77 (05/16 0700) Cardiac Rhythm:  [-] Normal sinus rhythm (05/16 0600) Resp:  [11-27] 21 (05/16 0700) BP: (103-142)/(53-107) 137/67 mmHg (05/16 0700) SpO2:  [91 %-98 %] 98 % (05/16 0700) Weight:  [85.5 kg (188 lb 7.9 oz)] 85.5 kg (188 lb 7.9 oz) (05/16 0500)  Filed Weights   05/06/13 0500 05/07/13 0500 05/08/13 0500  Weight: 88.724 kg (195 lb 9.6 oz) 86.9 kg (191 lb 9.3 oz) 85.5 kg (188 lb 7.9 oz)    Weight change: -1.4 kg (-3 lb 1.4 oz)       Intake/Output from previous day: 05/15 0701 - 05/16 0700 In: 720 [P.O.:720] Out: 2000 [Urine:1900; Chest Tube:100]      Current Meds: Scheduled Meds: . acetaminophen  1,000 mg Oral Q6H  . aspirin EC  325 mg Oral Daily  . atorvastatin  40 mg Oral q1800  . bisacodyl  10 mg Oral Daily   Or  . bisacodyl  10 mg Rectal Daily  . budesonide-formoterol  2 puff Inhalation BID  . docusate sodium  200 mg Oral Daily  . furosemide  40 mg Intravenous Daily  . insulin aspart  0-24 Units Subcutaneous Q6H  . levalbuterol  1.25 mg Nebulization Q8H  . lisinopril  10 mg Oral Daily  . metoprolol tartrate  12.5 mg Oral BID  . nicotine  21 mg Transdermal Daily  . pantoprazole  40 mg Oral Daily  . potassium chloride  20 mEq Oral Daily  . sodium chloride  3 mL Intravenous Q12H  . sodium  chloride  3 mL Intravenous Q12H   Continuous Infusions: . sodium chloride     PRN Meds:.sodium chloride, fentaNYL, metoprolol, morphine injection, ondansetron (ZOFRAN) IV, oxyCODONE, sodium chloride, sodium chloride, traMADol  Neurologic: intact Heart: RRR, rub with chest tubes in place, no murmur Lungs: Slightly diminished at bases;no rales, wheezes, or rhonchi. Minor subcutaneous emphysema base of left neck and upper chest.no air leak from tubes Abdomen: soft, non-tender; bowel sounds normal; no masses,  no organomegaly Extremities: Mild LE edema R>L Wound: Dressings clean and dry  Lab Results: CBC:  Recent Labs  05/07/13 0351 05/08/13 0311  WBC 12.8* 9.3  HGB 9.8* 9.6*  HCT 28.3* 27.9*  PLT 124* 143*   BMET:   Recent Labs  05/07/13 0351 05/08/13 0311  NA 135 133*  K 4.2 4.3  CL 101 97  CO2 25 28  GLUCOSE 117* 106*  BUN 33* 33*  CREATININE 1.03 1.02  CALCIUM 8.3* 8.6    PT/INR:  No results found for this basename: LABPROT, INR,  in the last 72 hours Radiology: Dg Chest 2 View  05/07/2013   *RADIOLOGY REPORT*  Clinical Data:  Left pneumothorax.  CHEST - 2 VIEW  Comparison: 05/06/2013  Findings: There has been recurrence of the small to moderate left hydropneumothorax.  Left chest tube is unchanged.  There is a moderate right pneumothorax with a hydropneumothorax component posteriorly at the base.  Tiny bilateral pleural effusions.  Subcutaneous emphysema in the neck and chest is unchanged.  Heart size and pulmonary vascularity are normal.  Improved atelectasis at both lung bases.  Central venous sheath has been removed.  IMPRESSION:  1.  Moderate right pneumothorax. 2.  Small to moderate left pneumothorax. 3.  Bibasilar hydropneumothorax components. 4.  Tiny bilateral pleural effusions.   Original Report Authenticated By: Francene Boyers, M.D.   Dg Chest Port 1 View  05/07/2013   *RADIOLOGY REPORT*  Clinical Data: Post chest tube insertion.  PORTABLE CHEST - 1 VIEW   Comparison: 05/07/2013  Findings: Prior CABG.  Left chest tube remains in place, unchanged. Lucency is noted along the medial left hemithorax which may represent a small medial left pneumothorax. No visible pneumothorax.  Interval placement of a right chest tube with re- expansion of the right lung.  No visible pneumothorax.  Heart is mildly enlarged.  There is vascular congestion.  Bilateral lower lobe airspace opacities are again noted, unchanged.  Small bilateral effusions.  Extensive subcutaneous emphysema noted.    No acute bony abnormality.  IMPRESSION: Suspect small medial left pneumothorax.  Interval placement right chest tube with re-expansion of the right lung.  Otherwise no significant change.   Original Report Authenticated By: Charlett Nose, M.D.   Dg Chest Port 1 View  05/07/2013   *RADIOLOGY REPORT*  Clinical Data: Right pneumothorax.  Left chest tube.  PORTABLE CHEST - 1 VIEW  Comparison: Chest 05/07/2013 at 5:21 a.m.  Findings: Moderate right pneumothorax seen on the study earlier today is unchanged.  There is atelectasis in the right lung base. Left chest tube is in place.  Small pneumothorax on the prior to the left chest is markedly decreased.  Heart size is upper normal. The patient is status post CABG. Extensive subcutaneous emphysema is noted.  IMPRESSION:  1.  No change in moderate right pneumothorax. 2.  Marked decrease in a small left pneumothorax with a chest tube in place.   Original Report Authenticated By: Holley Dexter, M.D.     Assessment/Plan: S/P Procedure(s) (LRB): CORONARY ARTERY BYPASS GRAFTING times five using Right Greater Saphenous Vein Graft harvested endoscopically and Left Internal Mammary Artery (N/A) INTRAOPERATIVE TRANSESOPHAGEAL ECHOCARDIOGRAM (N/A)  1.CV-SR.  On Lopressor 12.5 bid and Lisinopril 10 daily. Will increase Lisinopril for better control of blood pressure. 2.Pulmonary- Chest tubes with 100 cc last 24 hours. There is no sign of air leak in  either chest tube.CXR this am appears to show possible trace right apical  pneumothorax, bibasilar atelectasis, and small bilateral pleural effusions, and bilateral subcutaneous emphysema. 3.Volume overload-Continue diuresis 4.ABL anemia- H and H stable at 9.6 and 27.9 5. Right carotid artery occlusion-will need follow up as outpatient with vascular surgery 6.CBGs 107/106/103. Pre op HGA1C 5.4. Will need further surveillance as an outpatient 7.Mild thrombocytopenia-platelets 143,000 8.Transfer to [facility]U  Ardelle Balls PA-C 05/08/2013 7:54 AM patient examined and medical record reviewed,agree with above note. VAN TRIGT III,Kirtis Challis 05/08/2013

## 2013-05-08 NOTE — Progress Notes (Signed)
Pt transferred to 2017 from 2300; pt ambulated entire distance pushing wheelchair; once in room, pt assisted to chair; no complaints; will cont. To monitor.

## 2013-05-08 NOTE — Progress Notes (Signed)
UR Completed.  Mark Carpenter [phone] 05/08/2013

## 2013-05-08 NOTE — Op Note (Signed)
Mark Carpenter, Mark Carpenter NO.:  [id number]  MEDICAL RECORD NO.:  [id number]  LOCATION:                                 FACILITY:  PHYSICIAN:  Kerin Perna, M.D.  DATE OF BIRTH:  1956/11/29  DATE OF PROCEDURE:  05/07/2013 DATE OF DISCHARGE:                              OPERATIVE REPORT   OPERATION:  Placement of right chest tube.  PREOPERATIVE DIAGNOSIS:  Postoperative right pneumothorax.  POSTOPERATIVE DIAGNOSIS:  Postoperative right pneumothorax.  SURGEON:  Kerin Perna, MD  ANESTHESIA:  1% local lidocaine.  INDICATIONS:  The patient is a 57 year old Caucasian male, heavy smoker, with severe emphysema who had urgent CABG x5.  Postoperatively, he has developed subcutaneous emphysema and a pneumothorax on the left with a chest tube and a developing right pneumothorax.  A chest tube insertion was recommended to the patient for treatment of his 25% right pneumothorax at the apex.  Informed consent was obtained.  A proper time- out was performed.  OPERATIVE PROCEDURE:  The patient was prepared in the ICU bed after informed consent was obtained and a proper time-out was performed.  The right chest was prepped and draped as a sterile field.  1% local lidocaine was infiltrated in the third interspace.  A small 2-cm incision was made.  Further lidocaine was infiltrated down into the intercostal space.  A 20-French chest tube was then inserted into the right chest and directed to the apex, secured to the skin, and connected to a Pleur-Evac.  There was good fluctuation of the fluid count and air was decompressed from the right thorax.  The tube was secured to the skin and a sterile dressing was applied.  The chest x-ray is pending.     Kerin Perna, M.D.     PV/MEDQ  D:  05/07/2013  T:  05/08/2013  Job:  [id number]

## 2013-05-08 NOTE — Progress Notes (Signed)
**Note De-Identified Mark Carpenter Obfuscation** RT assessment: Upon assessment patient was sitting in chair on 2 LNC SATS 94%. VS WNL, BBS clear.  Patient has 35 smoking pack years.  CXR 05/08/13: no acute findings.  Patient denies SOB.  Xopenex 1.25 HHN is now BID per Respiratory Assessment Protocol.

## 2013-05-08 NOTE — Progress Notes (Signed)
Pt ambulated 550 feet in hallway with 2L O2 pushing wheelchair; pt tolerated ambulation well; pt assisted back to room to bed; call bell w/i reach; will cont. To monitor.

## 2013-05-08 NOTE — Progress Notes (Signed)
Pt sts he has walked x2 today and just got to bed and comfortable. Would like to walk later. Will notify RN. Will f/u in am. Ethelda Chick CES, ACSM 2:50 PM 05/08/2013

## 2013-05-09 ENCOUNTER — Encounter (HOSPITAL_COMMUNITY): Payer: Self-pay

## 2013-05-09 ENCOUNTER — Inpatient Hospital Stay (HOSPITAL_COMMUNITY): Payer: MEDICAID

## 2013-05-09 LAB — CBC
HCT: 31.7 % — ABNORMAL LOW (ref 39.0–52.0)
Hemoglobin: 10.9 g/dL — ABNORMAL LOW (ref 13.0–17.0)
MCH: 31.1 pg (ref 26.0–34.0)
MCHC: 34.4 g/dL (ref 30.0–36.0)
MCV: 90.3 fL (ref 78.0–100.0)
Platelets: 217 10*3/uL (ref 150–400)
RBC: 3.51 MIL/uL — ABNORMAL LOW (ref 4.22–5.81)
RDW: 13.4 % (ref 11.5–15.5)
WBC: 9.3 10*3/uL (ref 4.0–10.5)

## 2013-05-09 LAB — BASIC METABOLIC PANEL
BUN: 30 mg/dL — ABNORMAL HIGH (ref 6–23)
CO2: 27 mEq/L (ref 19–32)
Calcium: 8.8 mg/dL (ref 8.4–10.5)
Chloride: 98 mEq/L (ref 96–112)
Creatinine, Ser: 0.97 mg/dL (ref 0.50–1.35)
GFR calc Af Amer: >90 mL/min (ref >90)
GFR calc non Af Amer: 90 mL/min — ABNORMAL LOW (ref >90)
Glucose, Bld: 117 mg/dL — ABNORMAL HIGH (ref 70–99)
Potassium: 3.9 mEq/L (ref 3.5–5.1)
Sodium: 136 mEq/L (ref 135–145)

## 2013-05-09 LAB — GLUCOSE, CAPILLARY: Glucose-Capillary: 113 mg/dL — ABNORMAL HIGH (ref 70–99)

## 2013-05-09 NOTE — Progress Notes (Addendum)
5 Days Post-Op Procedure(s) (LRB): CORONARY ARTERY BYPASS GRAFTING times five using Right Greater Saphenous Vein Graft harvested endoscopically and Left Internal Mammary Artery (N/A) INTRAOPERATIVE TRANSESOPHAGEAL ECHOCARDIOGRAM (N/A) Subjective:  Mr. Skog complains of pain in his shoulder blades this morning.  Otherwise he states he is doing okay.  He is ambulating without difficulty. +BM  Objective: Vital signs in last 24 hours: Temp:  [97.7 F (36.5 C)-98.3 F (36.8 C)] 97.7 F (36.5 C) (05/17 0500) Pulse Rate:  [70-90] 70 (05/17 0500) Cardiac Rhythm:  [-] Normal sinus rhythm (05/16 2000) Resp:  [18-20] 18 (05/17 0500) BP: (119-140)/(55-67) 126/55 mmHg (05/17 0500) SpO2:  [95 %-99 %] 96 % (05/17 0500) Weight:  [186 lb 4.6 oz (84.5 kg)] 186 lb 4.6 oz (84.5 kg) (05/17 0500)  Intake/Output from previous day: 05/16 0701 - 05/17 0700 In: 720 [P.O.:720] Out: 1550 [Urine:1550]  General appearance: alert, cooperative and no distress Heart: regular rate and rhythm Lungs: diminished breath sounds bibasilar Abdomen: soft, non-tender; bowel sounds normal; no masses,  no organomegaly Extremities: edema trace Wound: clean and dry  Lab Results:  Recent Labs  05/08/13 0311 05/09/13 0428  WBC 9.3 9.3  HGB 9.6* 10.9*  HCT 27.9* 31.7*  PLT 143* 217   BMET:  Recent Labs  05/08/13 0311 05/09/13 0428  NA 133* 136  K 4.3 3.9  CL 97 98  CO2 28 27  GLUCOSE 106* 117*  BUN 33* 30*  CREATININE 1.02 0.97  CALCIUM 8.6 8.8    PT/INR: No results found for this basename: LABPROT, INR,  in the last 72 hours ABG    Component Value Date/Time   PHART 7.324* 05/04/2013 1857   HCO3 21.4 05/04/2013 1857   TCO2 24 05/05/2013 1654   ACIDBASEDEF 4.0* 05/04/2013 1857   O2SAT 91.0 05/04/2013 1857   CBG (last 3)   Recent Labs  05/08/13 1553 05/08/13 2048 05/09/13 0620  GLUCAP 139* 114* 113*    Assessment/Plan: S/P Procedure(s) (LRB): CORONARY ARTERY BYPASS GRAFTING times five using  Right Greater Saphenous Vein Graft harvested endoscopically and Left Internal Mammary Artery (N/A) INTRAOPERATIVE TRANSESOPHAGEAL ECHOCARDIOGRAM (N/A)  1. CV- NSR rate and pressure controlled, continue Lopressor, Lisinopril 2. S/P Bilateral Chest tubes- no air leak present, minimal output, CXR for this morning not done yet 3. Renal- creatinine WNL, volume status stable, on low dose Lasix 4. CBgs- controlled, not a diabetic will d/c sugar checks 5. Right Carotid Artery Occlusion- will follow up with Vascular surgery as outpatient 6. Dispo- will need CXR to be completed then we can assess possible chest tube removal   LOS: 11 days    BARRETT, ERIN 05/09/2013  Patient seen and examined. Agree with above. No air leak, CXR stable- will dc CT today

## 2013-05-09 NOTE — Progress Notes (Signed)
Bilateral chest tubes removed per MD order and protocol. Sites tied with sutures. Pt tolerated procedure well. Pt resting in bed with call bell within reach. Will continue to monitor.

## 2013-05-09 NOTE — Progress Notes (Signed)
CARDIAC REHAB PHASE I   PRE:  Rate/Rhythm: 71 SR  BP:  Supine:   Sitting: 132/60  Standing:    SaO2: 98 2lnc  MODE:  Ambulation: 700 ft   POST:  Rate/Rhythm: 88  BP:  Supine:   Sitting: 138/70  Standing:    SaO2: 96 2lnc  During ambulation pt stated that he may go home tomorrow.  o2 removed to check room air sat.    After 15 minutes of rest on side of bed rechecked o2 sat 90-91%  Pt denies any shortness of breath or distress. In anticipation of possible d/c tomorrow.  Reviewed with pt heart healthy diet, exercise guidelines,tobacco cessation,stermal precautions.  Pt agreed to have contact information sent to [facility] cardiac rehab phase II program.  Pt provided handouts, questions answered. Pt to bed with no complaints, call bell in place. Arna Medici RN BSN [phone]

## 2013-05-09 NOTE — Progress Notes (Signed)
Pt ambulated 550 ft with rolling walker on 2 liters of oxygen and tolerated activity well. Will continue to monitor.

## 2013-05-09 NOTE — Progress Notes (Signed)
Pt ambulated 550 ft with wheelchair and 2 liters of oxygen. Pt tolerated activity well. Will continue to monitor and encourage.

## 2013-05-10 ENCOUNTER — Inpatient Hospital Stay (HOSPITAL_COMMUNITY): Payer: MEDICAID

## 2013-05-10 DIAGNOSIS — Z951 Presence of aortocoronary bypass graft: Secondary | ICD-10-CM

## 2013-05-10 MED ORDER — NICOTINE 21 MG/24HR TD PT24: 1.0000 | MEDICATED_PATCH | Freq: Every day | TRANSDERMAL | Status: DC

## 2013-05-10 MED ORDER — OXYCODONE HCL 5 MG PO TABS: 5.0000 mg | ORAL_TABLET | ORAL | Status: DC | PRN

## 2013-05-10 MED ORDER — LISINOPRIL 10 MG PO TABS: 10.0000 mg | ORAL_TABLET | Freq: Every day | ORAL | Status: DC

## 2013-05-10 MED ORDER — ATORVASTATIN CALCIUM 40 MG PO TABS: 40.0000 mg | ORAL_TABLET | Freq: Every day | ORAL | Status: DC

## 2013-05-10 MED ORDER — FUROSEMIDE 40 MG PO TABS: 40.0000 mg | ORAL_TABLET | Freq: Every day | ORAL | Status: DC

## 2013-05-10 MED ORDER — BUDESONIDE-FORMOTEROL FUMARATE 160-4.5 MCG/ACT IN AERO: 2.0000 | INHALATION_SPRAY | Freq: Two times a day (BID) | RESPIRATORY_TRACT | Status: DC

## 2013-05-10 MED ORDER — POTASSIUM CHLORIDE CRYS ER 20 MEQ PO TBCR: 20.0000 meq | EXTENDED_RELEASE_TABLET | Freq: Every day | ORAL | Status: DC

## 2013-05-10 MED ORDER — METOPROLOL TARTRATE 25 MG PO TABS: 12.5000 mg | ORAL_TABLET | Freq: Two times a day (BID) | ORAL | Status: DC

## 2013-05-10 NOTE — Progress Notes (Addendum)
6 Days Post-Op Procedure(s) (LRB): CORONARY ARTERY BYPASS GRAFTING times five using Right Greater Saphenous Vein Graft harvested endoscopically and Left Internal Mammary Artery (N/A) INTRAOPERATIVE TRANSESOPHAGEAL ECHOCARDIOGRAM (N/A) Subjective:  Mark Carpenter is without complaints this morning.  He states he feels great and is hoping to go home today.  He was on 2L of oxygen during evaluation with sats of 98%.    Objective: Vital signs in last 24 hours: Temp:  [97.4 F (36.3 C)-98.8 F (37.1 C)] 98.6 F (37 C) (05/18 0527) Pulse Rate:  [73-80] 80 (05/18 0527) Cardiac Rhythm:  [-] Normal sinus rhythm (05/17 2100) Resp:  [18-20] 20 (05/18 0527) BP: (122-131)/(73-75) 125/73 mmHg (05/18 0527) SpO2:  [94 %-96 %] 96 % (05/18 0527) Weight:  [181 lb 9.6 oz (82.373 kg)] 181 lb 9.6 oz (82.373 kg) (05/18 0527)  Intake/Output from previous day: 05/17 0701 - 05/18 0700 In: 120 [P.O.:120] Out: 1675 [Urine:1675]  General appearance: alert, cooperative and no distress Neurologic: intact Heart: regular rate and rhythm Lungs: clear to auscultation bilaterally Abdomen: soft, non-tender; bowel sounds normal; no masses,  no organomegaly Extremities: edema trace Wound: clean and dry  Lab Results:  Recent Labs  05/08/13 0311 05/09/13 0428  WBC 9.3 9.3  HGB 9.6* 10.9*  HCT 27.9* 31.7*  PLT 143* 217   BMET:  Recent Labs  05/08/13 0311 05/09/13 0428  NA 133* 136  K 4.3 3.9  CL 97 98  CO2 28 27  GLUCOSE 106* 117*  BUN 33* 30*  CREATININE 1.02 0.97  CALCIUM 8.6 8.8    PT/INR: No results found for this basename: LABPROT, INR,  in the last 72 hours ABG    Component Value Date/Time   PHART 7.324* 05/04/2013 1857   HCO3 21.4 05/04/2013 1857   TCO2 24 05/05/2013 1654   ACIDBASEDEF 4.0* 05/04/2013 1857   O2SAT 91.0 05/04/2013 1857   CBG (last 3)   Recent Labs  05/08/13 1553 05/08/13 2048 05/09/13 0620  GLUCAP 139* 114* 113*    Assessment/Plan: S/P Procedure(s) (LRB): CORONARY  ARTERY BYPASS GRAFTING times five using Right Greater Saphenous Vein Graft harvested endoscopically and Left Internal Mammary Artery (N/A) INTRAOPERATIVE TRANSESOPHAGEAL ECHOCARDIOGRAM (N/A)  1. CV- NSR good rate and pressure control on Lopressor, Lisinopril 2. Pulm- CXR looks good with improvement of Sub q air, no evidence of pneumothorax post CT removal, encouraged continued use of IS at discharge 3. Renal- volume status is at baseline 4. Right Coronary Artery Occlusion- will need vascular follow up as outpatient 5. Dispo- patient doing well, good O2 sats this morning, I turned off patient's oxygen will recheck sats, patient for discharge today   LOS: 12 days    BARRETT, ERIN 05/10/2013  Patient seen and examined. Looks great, subq air resolving Home today

## 2013-05-10 NOTE — Progress Notes (Signed)
EPWs/ old CTs removed per MD order and protocol. Wire ends intact. Sterri strips placed over old CT suture sites. Pt tolerated activity well. Pt resting in Bed X 1 hour with call bell within reach. Will continue to monitor.

## 2013-05-10 NOTE — Discharge Summary (Signed)
Physician Discharge Summary  Patient ID: Mark Carpenter MRN: [id number] DOB/AGE: 01-15-1956 57 y.o.  Admit date: 04/28/2013 Discharge date: 05/10/2013  Admission Diagnoses:  Patient Active Problem List   Diagnosis Date Noted  . Volume excess 05/05/2013  . GERD (gastroesophageal reflux disease) 04/29/2013  . High triglycerides 04/29/2013  . Carotid artery bruit 04/29/2013  . HTN (hypertension) 04/29/2013  . Unstable angina 04/28/2013  . Cigarette smoker 04/28/2013   Discharge Diagnoses:   Patient Active Problem List   Diagnosis Date Noted  . S/P CABG x 5 05/10/2013  . Volume excess 05/05/2013  . GERD (gastroesophageal reflux disease) 04/29/2013  . High triglycerides 04/29/2013  . Carotid artery bruit 04/29/2013  . HTN (hypertension) 04/29/2013  . Unstable angina 04/28/2013  . Cigarette smoker 04/28/2013   Discharged Condition: good  History of Present Illness:   Mark Carpenter is a 57 yo white male with significant smoking history and COPD.  The patient presented to the Emergency Department with complaints of chest pain.  The patient states that over the past several weeks he has been experiencing a dull chest pain.  He describes the pain as something "sitting" on his chest.  These episodes usually relieve by rest however they have been increasing in frequency and severity prompting presentation to the ED.  Workup in the ED showed EKG changes in the lateral leads.  Initial cardiac enzymes were negative.  However due this angina being unstable it was felt he would benefit from further workup and he was transferred to [facility] for cardiac catheterization.  Hospital Course:   Upon arrival patient was chest pain free.  He was evaluated by Dr. Gala Romney at which time he was agreeable patient would benefit from cardiac catheterization.  It was also felt on exam there was evidence of diffuse vascular disease and carotid ultrasound was obtained.  He was started on Lopressor and Norvasc for  hypertension.  The patient underwent cardiac catheterization on 04/30/2013 which showed evidence of CAD with total occlusion of OM and IVUS performed on LAD showed evidence of Left Main involvement.  It was felt Coronary Bypass would be the best treatment option for this patient and TCTS was consulted. The patient was evaluated by Dr. Donata [facility] who reviewed the patient's catheterization films and felt Coronary Bypass would be an appropriate treatment plan.  The risks and benefits of the procedure were explained to the patient and he was agreeable to proceed.  Preoperative workup showed evidence of total occlusion of the Right Internal Carotid Artery and moderate to severe occlusion of the Left Internal Carotid artery.  Vascular surgery was consulted and felt that CTA of head and neck would be indicated to further evaluated the left sided stenosis.  The CTA showed the left ICA to be less than 60% occluded and no intervention would be required at this time.  However the patient would require further monitoring on an outpatient basis.  Therefore, he was taken to the operating room on 05/04/2013.  He underwent CABG x 5 utilizing LIMA to LAD, SVG to Ramus Intermediate, Sequential SVG to OM1 and Distal Circumflex, and SVG to PDA.  He also underwent Endoscopic Saphenous vein harvest of the right leg.  He tolerated the procedure well and was taken to the SICU in stable condition.  The patient was extubated the evening of surgery.  During his stay in the ICU the patient was weaned off his Nitroglycerin drip as tolerated.  He was treated with IV diuresis for volume overload.  Patient developed sub-q air along left chest and neck requiring placement of a left sided chest tube.  This later progressed and developed on patient's right side involving his face and eyelid.  This resulted in placement of right sided chest tube.  The patient continued to make progress.  He was maintaining NSR and blood pressure control.  His chest tubes  remained in place with no evidence of air leak.  The chest tubes were removed from suction and the patient was transferred to the step down unit in stable condition.  The patient has continued to do well.  CXR obtained showed improvement of Sub-Q emphysema and no evidence of pneumothorax.  There was no air leak present on the chest tube chamber and both chest tubes were removed without difficulty.  Follow up CXR obtained showed further improvement of Sub-Q emphysema and no development of Pneumothorax.  The patient has been weaned off oxygen and is maintaining good oxygen saturation.  He is maintaining NSR and his pacing wires have been removed.  He continues to progress.  He is ambulating without difficulty and is maintaining a cardiac diet.  He is medically stable at this time and will be discharged home today.  He will follow up with Dr. Donata Clay in 3 weeks with a CXR prior to his appointment.  He will also need to set up follow up with [facility].     Significant Diagnostic Studies:   Cardiac Catheterization:   Hemodynamics:  AO 146/82  LV 143/16  Coronary angiography:  Coronary dominance: right  Left mainstem: At least 60% distal left main stenosis.  Left anterior descending (LAD): 20-30% proximal stenosis. Otherwise luminal irregularities.  Left circumflex (LCx): 60-70% ostial LCx stenosis. 50% mid LCx stenosis. There is a high obtuse marginal that branches and then is totally occluded. There are faint left to left collaterals filling the distal vessel.  Right coronary artery (RCA): 50% mid-vessel stenosis, 70% distal vessel stenosis. 70% mid PDA stenosis. There is a moderate acute marginal with 90% proximal stenosis.  Left ventriculography: Left ventricular systolic function is normal, LVEF is estimated at 55-60%. There appears to be some hypokinesis in the mid anterolateral wall.  Final Conclusions: Culprit lesion is likely the totally occluded proximal high OM. By symptoms, this  probably happened about a week ago. Cardiac enzymes were negative. There is a significant distal LM stenosis extending into the ostial LCx. The distal LM appears at least 60%, may be worse. The ostial LCx I would estimate at 60-70%.   Intravascular ultrasound:  The proximal LAD has nonobstructive circumferential plaque. The distal left mainstem has focal soft plaque that is ovoid and circumferential.  The mid and proximal left main R. angiographically normal. The mid left main as a reference area of 19 mm. The distal left main in the area of circumferential but eccentric plaque hasn't area of 6.3 mm. This is a 67% stenosis.   Carotid Artery Ultrasound:   - Right = Occlusion of right internal carotid artery. Left = Internal carotid artery velocities are in the 60-79% range. This could be compensatory flow due to contralateral occlusion.  Antegrade vertebral flow bilaterally. - Left ICA/CCA ratio = 2.35. - Palpable pedal pulses.  CTA Head/NECK:  Right internal carotid artery is occluded at the origin. Right  cavernous carotid on the right is occluded.  Atherosclerotic calcified and noncalcified plaque at the left  carotid bifurcation. 30% diameter stenosis left internal carotid  artery. 60% diameter stenosis left external carotid artery.  Both vertebral arteries are widely patent.  Treatments: surgery:   1. Coronary artery bypass grafting x5 (left internal mammary artery to  left anterior descending, saphenous vein graft to ramus  intermediate, sequential saphenous vein graft to OM-1 and distal  circumflex, saphenous vein graft to posterior descending).  2. Endoscopic harvest of right leg greater saphenous vein.  Disposition: Home    Medication List    STOP taking these medications       ibuprofen 200 MG tablet  Commonly known as:  ADVIL,MOTRIN      TAKE these medications       aspirin EC 325 MG tablet  Take 325 mg by mouth every morning.     atorvastatin 40 MG  tablet  Commonly known as:  LIPITOR  Take 1 tablet (40 mg total) by mouth daily at 6 PM.     budesonide-formoterol 160-4.5 MCG/ACT inhaler  Commonly known as:  SYMBICORT  Inhale 2 puffs into the lungs 2 (two) times daily.     diphenhydramine-acetaminophen 25-500 MG Tabs  Commonly known as:  TYLENOL PM  Take 2 tablets by mouth at bedtime.     furosemide 40 MG tablet  Commonly known as:  LASIX  Take 1 tablet (40 mg total) by mouth daily. For 5 days     lisinopril 10 MG tablet  Commonly known as:  PRINIVIL,ZESTRIL  Take 1 tablet (10 mg total) by mouth daily.     metoprolol tartrate 25 MG tablet  Commonly known as:  LOPRESSOR  Take 0.5 tablets (12.5 mg total) by mouth 2 (two) times daily.     nicotine 21 mg/24hr patch  Commonly known as:  NICODERM CQ - dosed in mg/24 hours  Place 1 patch onto the skin daily.     omeprazole 20 MG capsule  Commonly known as:  PRILOSEC  Take 20 mg by mouth every morning.     oxyCODONE 5 MG immediate release tablet  Commonly known as:  Oxy IR/ROXICODONE  Take 1-2 tablets (5-10 mg total) by mouth every 4 (four) hours as needed.     potassium chloride SA 20 MEQ tablet  Commonly known as:  K-DUR,KLOR-CON  Take 1 tablet (20 mEq total) by mouth daily. For 5 Days       The patient has been discharged on:   1.Beta Blocker:  Yes [  x ]                              No   [   ]                              If No, reason:  2.Ace Inhibitor/ARB: Yes [ x  ]                                     No  [    ]                                     If No, reason:  3.Statin:   Yes [ x  ]                  No  [   ]  If No, reason:  4.Ecasa:  Yes  [ x  ]                  No   [   ]                  If No, reason:     Discharge Orders   Future Orders Complete By Expires     Amb Referral to Cardiac Rehabilitation  As directed            Follow-up Information   Follow up with VAN Dinah Beers, MD In 3 weeks. (Office will mail your  appointment)    Contact information:   [address] [place] [address] [phone]       Follow up with [facility] In 3 weeks. (Please get CXR 1 hour prior to your appointment)    Contact information:   [place]       Schedule an appointment as soon as possible for a visit with Arvilla Meres, MD. (Please contact office to set up 2-4 week follow up )    Contact information:   [address] [place] [address] [phone]       Follow up with Nilda Simmer, MD. (Please contact office to set up follow up appointment for 6 months)    Contact information:   [address] [place] [address] [phone]       Signed: Lowella Dandy 05/10/2013, 10:14 AM

## 2013-05-10 NOTE — Progress Notes (Signed)
Pt ambulated 550 ft independently and tolerated activity well. Will continue to monitor.

## 2013-05-14 NOTE — Discharge Summary (Signed)
patient examined and medical record reviewed,agree with above note. VAN TRIGT III,Jance Siek 05/14/2013    

## 2013-05-28 ENCOUNTER — Other Ambulatory Visit: Payer: Self-pay

## 2013-05-28 DIAGNOSIS — G8918 Other acute postprocedural pain: Secondary | ICD-10-CM

## 2013-05-28 MED ORDER — HYDROCODONE-ACETAMINOPHEN 10-325 MG PO TABS: 1.0000 | ORAL_TABLET | ORAL | Status: DC | PRN

## 2013-05-28 NOTE — Telephone Encounter (Signed)
RX for Norco 10/325 mg 1 tab every 4-6 hours prn/pain #40 no refills called to pharm.

## 2013-06-01 ENCOUNTER — Other Ambulatory Visit: Payer: Self-pay | Admitting: *Deleted

## 2013-06-01 DIAGNOSIS — I2510 Atherosclerotic heart disease of native coronary artery without angina pectoris: Secondary | ICD-10-CM

## 2013-06-02 ENCOUNTER — Ambulatory Visit
Admission: RE | Admit: 2013-06-02 | Discharge: 2013-06-02 | Disposition: A | Discharge status: Home / Self Care | Payer: No Typology Code available for payment source | Source: Ambulatory Visit | Attending: Cardiothoracic Surgery | Admitting: Cardiothoracic Surgery

## 2013-06-02 ENCOUNTER — Ambulatory Visit (INDEPENDENT_AMBULATORY_CARE_PROVIDER_SITE_OTHER): Payer: Self-pay | Admitting: Cardiothoracic Surgery

## 2013-06-02 ENCOUNTER — Encounter: Payer: Self-pay | Admitting: Cardiothoracic Surgery

## 2013-06-02 VITALS — BP 145/90 | HR 110 | Resp 20 | Ht 71.0 in | Wt 181.0 lb

## 2013-06-02 DIAGNOSIS — Z951 Presence of aortocoronary bypass graft: Secondary | ICD-10-CM

## 2013-06-02 DIAGNOSIS — I2510 Atherosclerotic heart disease of native coronary artery without angina pectoris: Secondary | ICD-10-CM

## 2013-06-02 NOTE — Progress Notes (Signed)
PCP is No primary provider on file. Referring Provider is Tonny Bollman, MD  Chief Complaint  Patient presents with  . Routine Post Op    F/U from surgery with CXR, S/P CABG x 5 on 05/04/13    HPI: 57 year old Caucasian male reformed smoker returns for his first 3 week visit postop CABG x5. He presented with unstable angina and severe multivessel disease. He had severe emphysema-COPD. Postop he did well with his cardiac function but developed significant sub-cutaneous emphysema and recurrent pneumothoraces requiring chest tube drainage. He is ultimately not reversed and he was discharged home in sinus rhythm on room air. His had no recurrent angina and the surgical incisions are healing well. He has no symptoms of CHF He is completely stop smoking   Past Medical History  Diagnosis Date  . GERD (gastroesophageal reflux disease)   . Tobacco abuse     smoker 2 pk/day    Past Surgical History  Procedure Laterality Date  . Appendectomy    . Coronary artery bypass graft N/A 05/04/2013    Procedure: CORONARY ARTERY BYPASS GRAFTING times five using Right Greater Saphenous Vein Graft harvested endoscopically and Left Internal Mammary Artery;  Surgeon: Kerin Perna, MD;  Location: [facility] OR;  Service: Open Heart Surgery;  Laterality: N/A;  . Intraoperative transesophageal echocardiogram N/A 05/04/2013    Procedure: INTRAOPERATIVE TRANSESOPHAGEAL ECHOCARDIOGRAM;  Surgeon: Kerin Perna, MD;  Location: [facility] OR;  Service: Open Heart Surgery;  Laterality: N/A;    Family History  Problem Relation Age of Onset  . Adopted: Yes    Social History History  Substance Use Topics  . Smoking status: Former Smoker -- 1.50 packs/day for 35 years    Types: Cigarettes    Quit date: 05/04/2013  . Smokeless tobacco: Not on file  . Alcohol Use: No    Current Outpatient Prescriptions  Medication Sig Dispense Refill  . aspirin EC 325 MG tablet Take 325 mg by mouth every morning.      Marland Kitchen atorvastatin  (LIPITOR) 40 MG tablet Take 1 tablet (40 mg total) by mouth daily at 6 PM.  30 tablet  3  . budesonide-formoterol (SYMBICORT) 160-4.5 MCG/ACT inhaler Inhale 2 puffs into the lungs 2 (two) times daily.  1 Inhaler  12  . diphenhydramine-acetaminophen (TYLENOL PM) 25-500 MG TABS Take 2 tablets by mouth at bedtime.      Marland Kitchen HYDROcodone-acetaminophen (NORCO) 10-325 MG per tablet Take 1 tablet by mouth every 4 (four) hours as needed for pain.  40 tablet  0  . lisinopril (PRINIVIL,ZESTRIL) 10 MG tablet Take 1 tablet (10 mg total) by mouth daily.  30 tablet  3  . metoprolol tartrate (LOPRESSOR) 25 MG tablet Take 0.5 tablets (12.5 mg total) by mouth 2 (two) times daily.  60 tablet  3  . omeprazole (PRILOSEC) 20 MG capsule Take 20 mg by mouth every morning.       No current facility-administered medications for this visit.    No Known Allergies  Review of Systems appetite and strength slowly improving still problems with insomnia depression and decreased energy  BP 145/90  Pulse 110  Resp 20  Ht 5\' 11"  (1.803 m)  Wt 181 lb (82.101 kg)  BMI 25.26 kg/m2  SpO2 96% Physical Exam Alert and comfortable Lungs clear, subcutaneous emphysema resolved Heart rate regular Sternal incision and leg incision is well-healed No pedal edema  Diagnostic Tests: Chest x-ray clear-no pneumothorax or pleural effusion  Impression: Doing well one month after multivessel CABG Activity  reviewed with patient-he may drive lift up to 15 pounds. No lifting more than 20 pounds until 3 months after surgery.  Plan: Continue current medications Refill for oxycodone 5 mg tablets #30 provided He return to care of his medical physicians He understands he has a right chronic total occlusion and will make an appointment with Dr. Imogene Burn a [facility]

## 2013-06-02 NOTE — Patient Instructions (Addendum)
Do not smoke. °

## 2013-06-03 ENCOUNTER — Ambulatory Visit: Payer: Self-pay | Admitting: Cardiothoracic Surgery

## 2013-06-07 ENCOUNTER — Emergency Department (HOSPITAL_COMMUNITY)
Admission: EM | Admit: 2013-06-07 | Discharge: 2013-06-07 | Disposition: A | Discharge status: Home / Self Care | Payer: Self-pay | Attending: Emergency Medicine | Admitting: Emergency Medicine

## 2013-06-07 DIAGNOSIS — K219 Gastro-esophageal reflux disease without esophagitis: Secondary | ICD-10-CM | POA: Insufficient documentation

## 2013-06-07 DIAGNOSIS — Z7982 Long term (current) use of aspirin: Secondary | ICD-10-CM | POA: Insufficient documentation

## 2013-06-07 DIAGNOSIS — Z79899 Other long term (current) drug therapy: Secondary | ICD-10-CM | POA: Insufficient documentation

## 2013-06-07 DIAGNOSIS — Z87891 Personal history of nicotine dependence: Secondary | ICD-10-CM | POA: Insufficient documentation

## 2013-06-07 DIAGNOSIS — H6123 Impacted cerumen, bilateral: Secondary | ICD-10-CM

## 2013-06-07 DIAGNOSIS — H6120 Impacted cerumen, unspecified ear: Secondary | ICD-10-CM | POA: Insufficient documentation

## 2013-06-07 NOTE — ED Provider Notes (Signed)
History  This chart was scribed for Mark Gaskins, MD by Greggory Stallion, ED Scribe. This patient was seen in room APFT22/APFT22 and the patient's care was started at 1:47 PM.  CSN: [id number]  Arrival date & time 06/07/13  1252    Chief Complaint  Patient presents with  . Ear Fullness    Patient is a 57 y.o. male presenting with plugged ear sensation. The history is provided by the patient. No language interpreter was used.  Ear Fullness This is a new problem. The current episode started more than 2 days ago.    HPI Comments: Mark Carpenter is a 57 y.o. male who presents to the Emergency Department complaining of gradual onset, constant left ear fullness that started 3 days ago. Pt denies injury to ear. Pt states he could pull his earlobe and it becomes unstopped. Pt states he put a few drops of peroxide in his ear with no relief. He states it now will not become unstopped.   Past Medical History  Diagnosis Date  . GERD (gastroesophageal reflux disease)   . Tobacco abuse     smoker 2 pk/day    Past Surgical History  Procedure Laterality Date  . Appendectomy    . Coronary artery bypass graft N/A 05/04/2013    Procedure: CORONARY ARTERY BYPASS GRAFTING times five using Right Greater Saphenous Vein Graft harvested endoscopically and Left Internal Mammary Artery;  Surgeon: Kerin Perna, MD;  Location: [facility] OR;  Service: Open Heart Surgery;  Laterality: N/A;  . Intraoperative transesophageal echocardiogram N/A 05/04/2013    Procedure: INTRAOPERATIVE TRANSESOPHAGEAL ECHOCARDIOGRAM;  Surgeon: Kerin Perna, MD;  Location: [facility] OR;  Service: Open Heart Surgery;  Laterality: N/A;    Family History  Problem Relation Age of Onset  . Adopted: Yes    History  Substance Use Topics  . Smoking status: Former Smoker -- 1.50 packs/day for 35 years    Types: Cigarettes    Quit date: 05/04/2013  . Smokeless tobacco: Not on file  . Alcohol Use: No      Review of Systems   Constitutional: Negative for fever.  HENT:       Ear fullness  All other systems reviewed and are negative.    Allergies  Review of patient's allergies indicates no known allergies.  Home Medications   Current Outpatient Rx  Name  Route  Sig  Dispense  Refill  . aspirin EC 325 MG tablet   Oral   Take 325 mg by mouth every morning.         Marland Kitchen atorvastatin (LIPITOR) 40 MG tablet   Oral   Take 1 tablet (40 mg total) by mouth daily at 6 PM.   30 tablet   3   . budesonide-formoterol (SYMBICORT) 160-4.5 MCG/ACT inhaler   Inhalation   Inhale 2 puffs into the lungs 2 (two) times daily.   1 Inhaler   12   . diphenhydramine-acetaminophen (TYLENOL PM) 25-500 MG TABS   Oral   Take 2 tablets by mouth at bedtime.         Marland Kitchen HYDROcodone-acetaminophen (NORCO) 10-325 MG per tablet   Oral   Take 1 tablet by mouth every 4 (four) hours as needed for pain.   40 tablet   0   . lisinopril (PRINIVIL,ZESTRIL) 10 MG tablet   Oral   Take 1 tablet (10 mg total) by mouth daily.   30 tablet   3   . metoprolol tartrate (LOPRESSOR) 25 MG tablet  Oral   Take 0.5 tablets (12.5 mg total) by mouth 2 (two) times daily.   60 tablet   3   . omeprazole (PRILOSEC) 20 MG capsule   Oral   Take 20 mg by mouth every morning.           BP 128/81  Pulse 110  Temp(Src) 97.8 F (36.6 C) (Oral)  Ht 5\' 11"  (1.803 m)  Wt 183 lb (83.008 kg)  BMI 25.53 kg/m2  SpO2 99%  Physical Exam  CONSTITUTIONAL: Well developed/well nourished HEAD: Normocephalic/atraumatic EYES: EOMI/PERRL ENMT: Mucous membranes moist, bilateral cerumen impaction NECK: supple no meningeal signs SPINE:entire spine nontender CV: S1/S2 noted, no murmurs/rubs/gallops noted LUNGS: Lungs are clear to auscultation bilaterally, no apparent distress ABDOMEN: soft, nontender, no rebound or guarding GU:no cva tenderness NEURO: Pt is awake/alert, moves all extremitiesx4 EXTREMITIES: pulses normal, full ROM SKIN: warm, color  normal, well healing midline scar to chest PSYCH: no abnormalities of mood noted  ED Course  Procedures (including critical care time)  DIAGNOSTIC STUDIES: Oxygen Saturation is 99% on RA, normal by my interpretation.    COORDINATION OF CARE: 1:50 PM-Discussed treatment plan with pt at bedside and pt agreed to plan.   Nurses irrigated both ears and pt tolerated well and feels improved Stable for d/c   MDM  Nursing notes including past medical history and social history reviewed and considered in documentation       I personally performed the services described in this documentation, which was scribed in my presence. The recorded information has been reviewed and is accurate.     Mark Gaskins, MD 06/07/13 [phone]

## 2013-06-07 NOTE — ED Notes (Signed)
States his left ear feels stopped up x3 days

## 2013-06-09 ENCOUNTER — Encounter (HOSPITAL_COMMUNITY): Payer: Self-pay | Admitting: Emergency Medicine

## 2013-07-30 IMAGING — CR DG CHEST 2V
2 series · 2 of 2 positions shown · non-contrast
Comparison: 05/10/2013

CLINICAL DATA: Follow up of CABG 1 month ago.  Shortness of breath.
Ex-smoker.

CHEST - 2 VIEW

[w chest pa *]
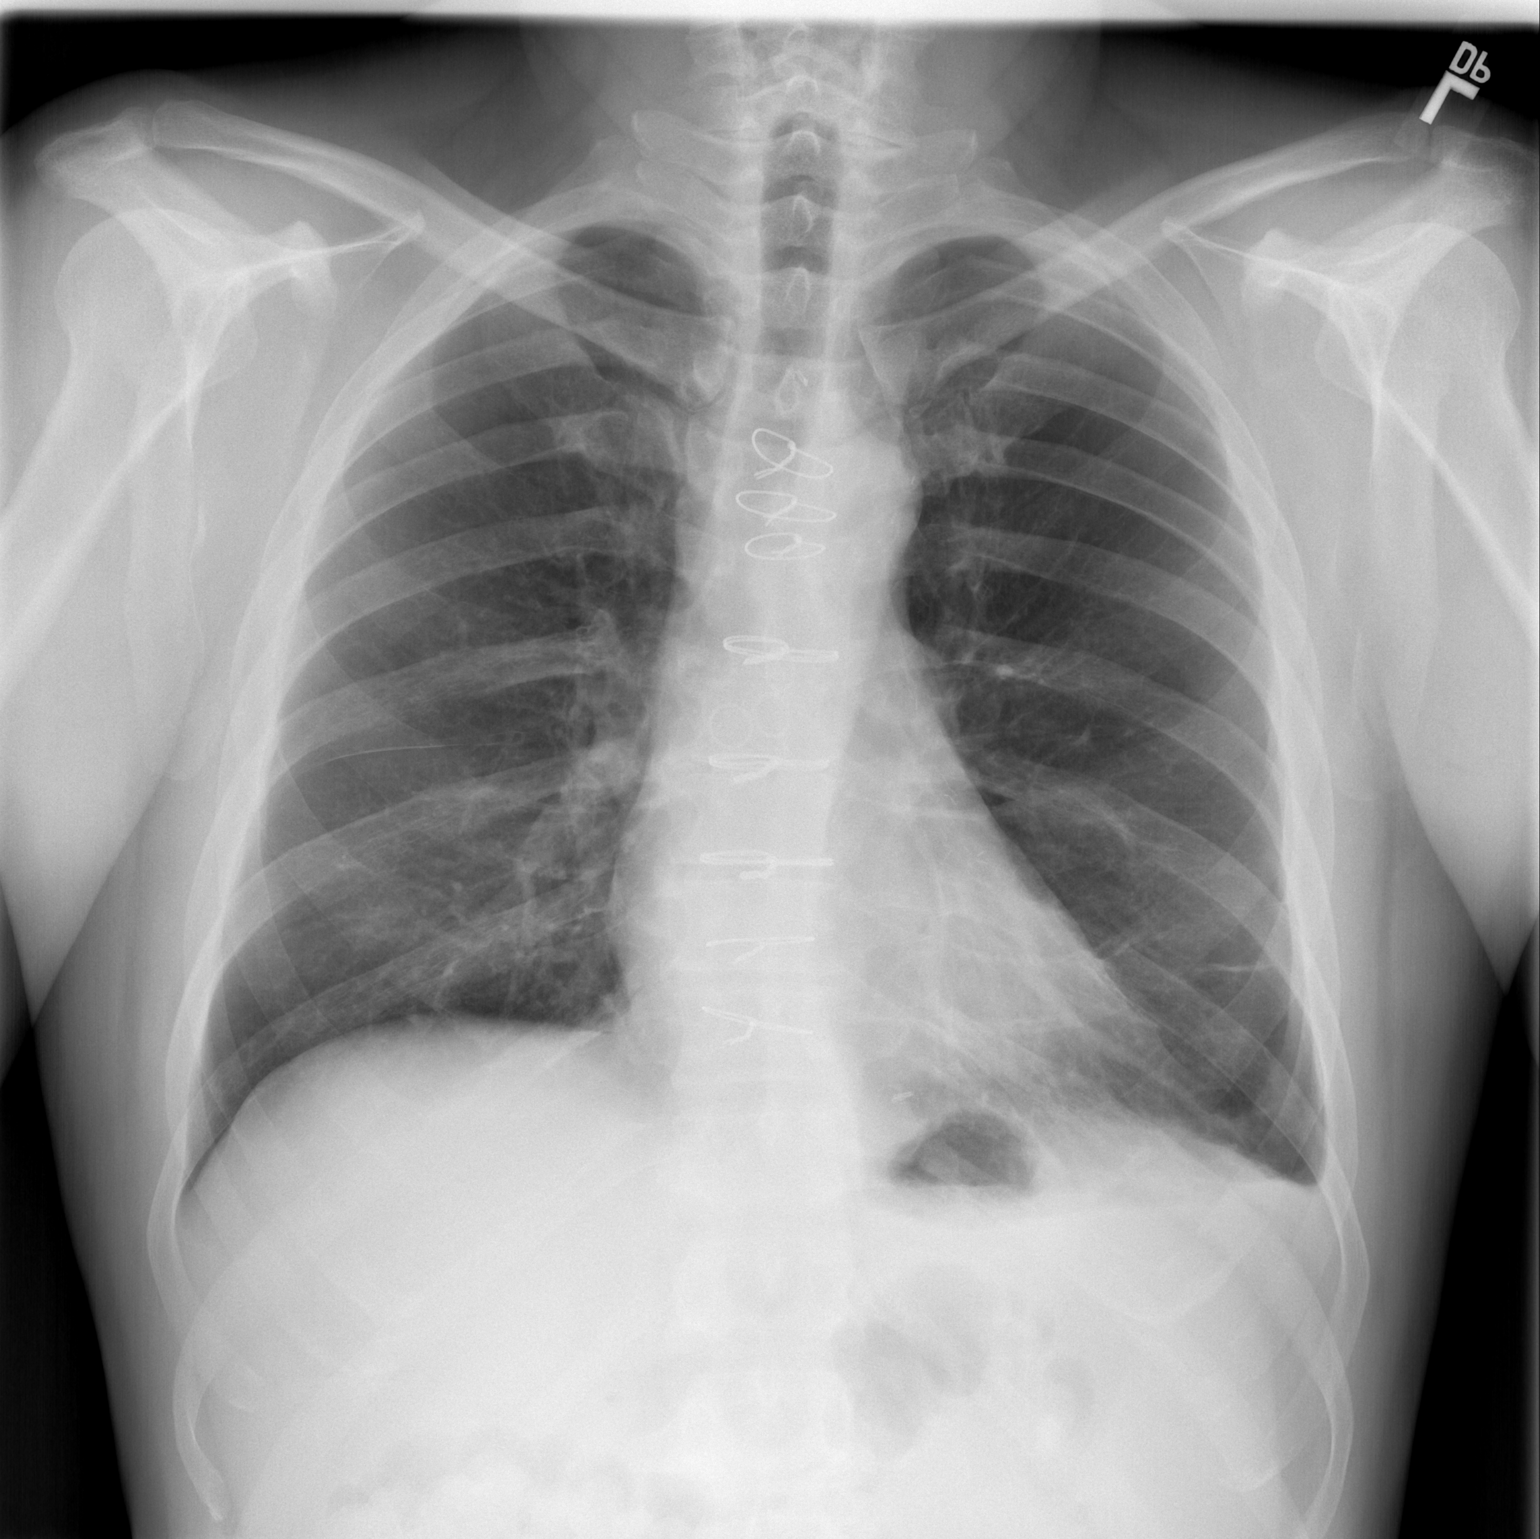

[w chest lat *]
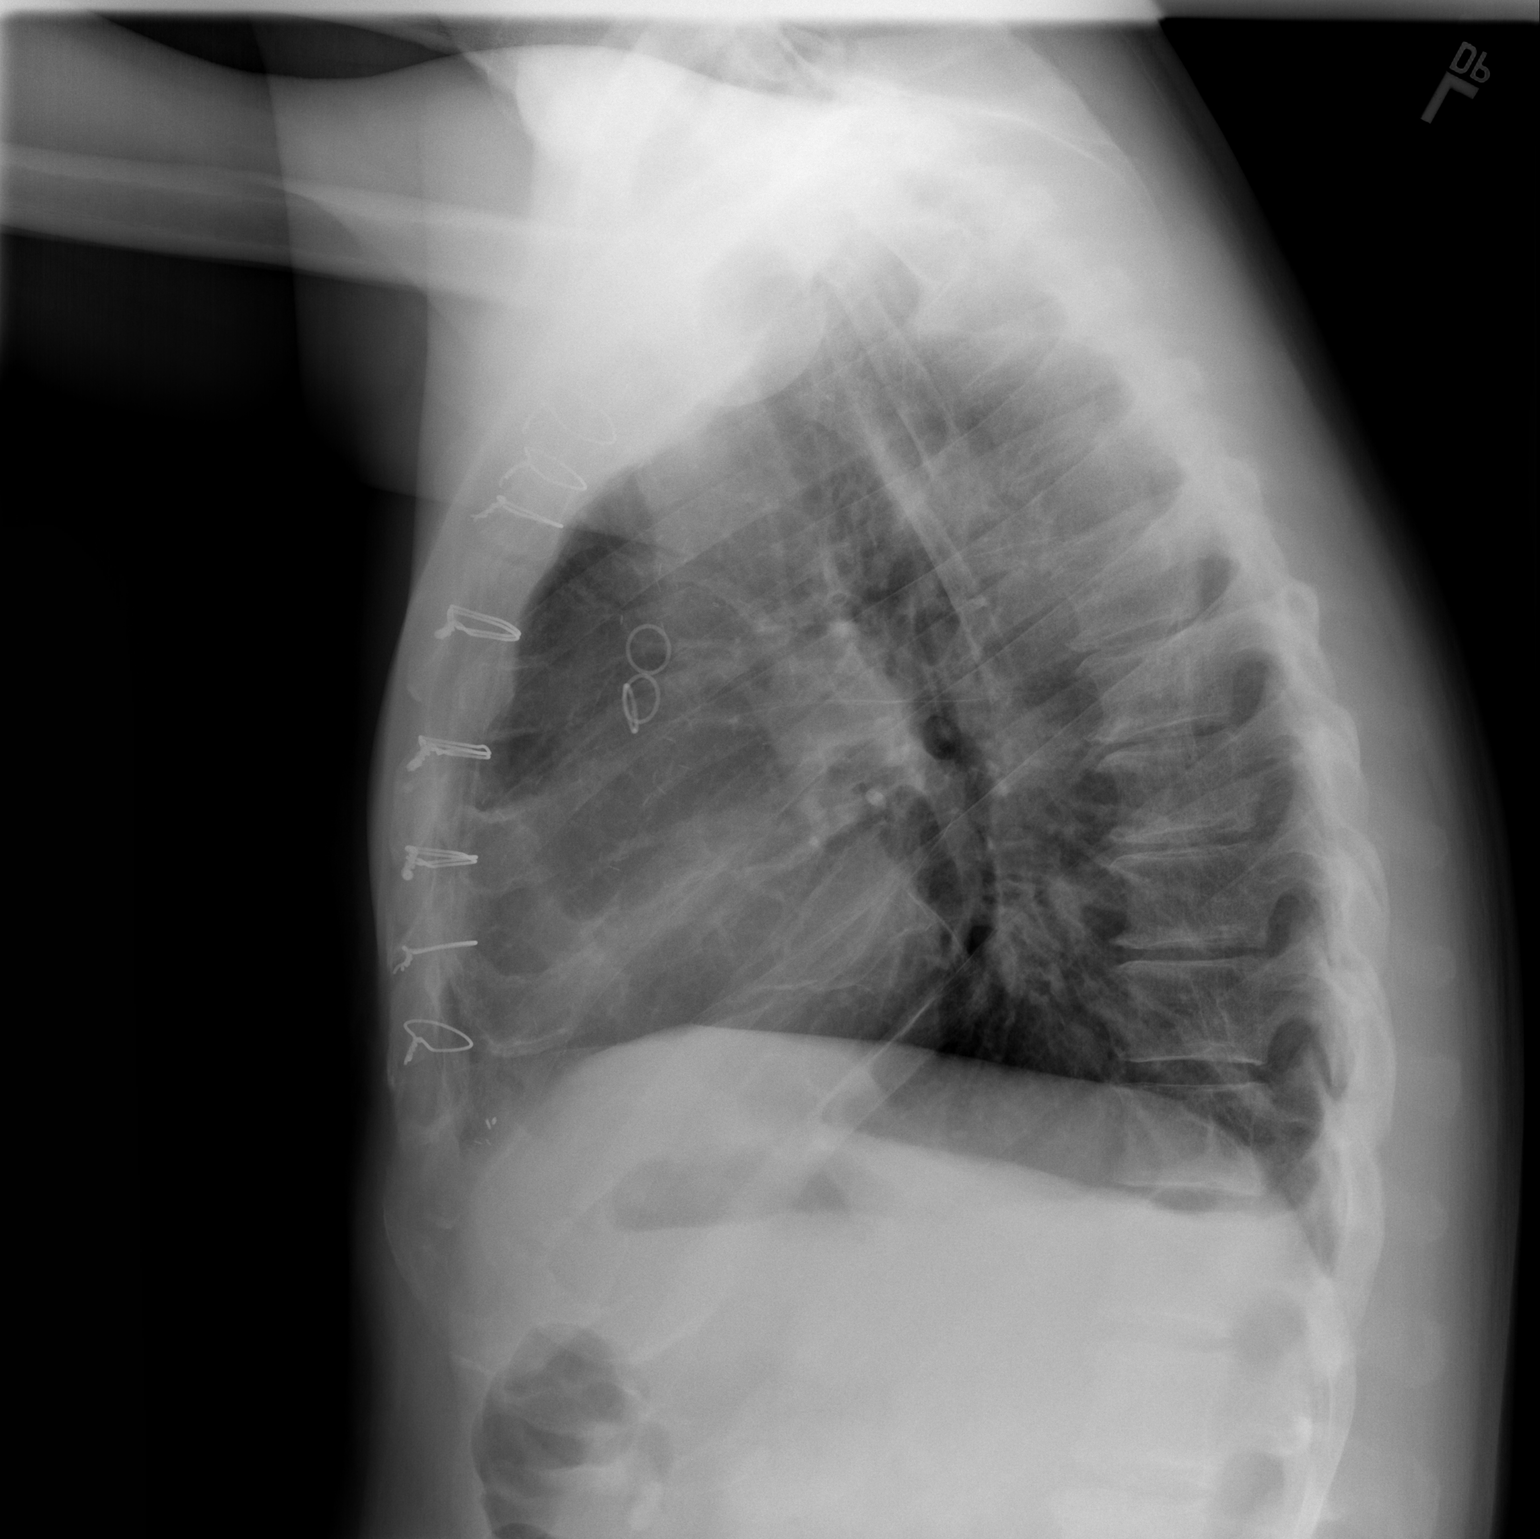

[2 of 2 positions shown; findings below may reference images not displayed]

FINDINGS: Prior median sternotomy.  Midline trachea.  Normal heart
size and mediastinal contours.  Minimal blunting of the left
costophrenic angle on the frontal, likely due to minimal pleural
thickening. No pneumothorax.  Previous described subcutaneous
emphysema and possible pneumomediastinum have resolved.  Mild
subsegmental atelectasis at the left lung base. No congestive
failure.
IMPRESSION: Trace left-sided pleural  thickening blunting the left costophrenic
angle.

Mild atelectasis at the left lung base.

## 2014-01-12 ENCOUNTER — Telehealth: Payer: Self-pay

## 2014-04-20 ENCOUNTER — Other Ambulatory Visit: Payer: Self-pay | Admitting: *Deleted

## 2014-04-20 DIAGNOSIS — I6529 Occlusion and stenosis of unspecified carotid artery: Secondary | ICD-10-CM

## 2014-05-28 ENCOUNTER — Other Ambulatory Visit (HOSPITAL_COMMUNITY): Payer: Self-pay

## 2014-05-28 ENCOUNTER — Ambulatory Visit: Payer: Self-pay | Admitting: Vascular Surgery

## 2014-12-02 ENCOUNTER — Encounter (HOSPITAL_COMMUNITY): Payer: Self-pay | Admitting: Cardiovascular Disease

## 2014-12-27 ENCOUNTER — Encounter (HOSPITAL_COMMUNITY): Payer: Self-pay | Admitting: Emergency Medicine

## 2014-12-27 ENCOUNTER — Emergency Department (HOSPITAL_COMMUNITY)
Admission: EM | Admit: 2014-12-27 | Discharge: 2014-12-27 | Disposition: A | Discharge status: Home / Self Care | Payer: Self-pay | Attending: Emergency Medicine | Admitting: Emergency Medicine

## 2014-12-27 ENCOUNTER — Emergency Department (HOSPITAL_COMMUNITY): Payer: Self-pay

## 2014-12-27 DIAGNOSIS — M5136 Other intervertebral disc degeneration, lumbar region: Secondary | ICD-10-CM | POA: Insufficient documentation

## 2014-12-27 DIAGNOSIS — I10 Essential (primary) hypertension: Secondary | ICD-10-CM | POA: Insufficient documentation

## 2014-12-27 DIAGNOSIS — M545 Low back pain: Secondary | ICD-10-CM | POA: Insufficient documentation

## 2014-12-27 DIAGNOSIS — E785 Hyperlipidemia, unspecified: Secondary | ICD-10-CM | POA: Insufficient documentation

## 2014-12-27 DIAGNOSIS — Z720 Tobacco use: Secondary | ICD-10-CM | POA: Insufficient documentation

## 2014-12-27 DIAGNOSIS — Z7982 Long term (current) use of aspirin: Secondary | ICD-10-CM | POA: Insufficient documentation

## 2014-12-27 DIAGNOSIS — M549 Dorsalgia, unspecified: Secondary | ICD-10-CM

## 2014-12-27 DIAGNOSIS — M159 Polyosteoarthritis, unspecified: Secondary | ICD-10-CM

## 2014-12-27 DIAGNOSIS — K219 Gastro-esophageal reflux disease without esophagitis: Secondary | ICD-10-CM | POA: Insufficient documentation

## 2014-12-27 DIAGNOSIS — M150 Primary generalized (osteo)arthritis: Secondary | ICD-10-CM

## 2014-12-27 DIAGNOSIS — Z955 Presence of coronary angioplasty implant and graft: Secondary | ICD-10-CM | POA: Insufficient documentation

## 2014-12-27 DIAGNOSIS — M1611 Unilateral primary osteoarthritis, right hip: Secondary | ICD-10-CM | POA: Insufficient documentation

## 2014-12-27 DIAGNOSIS — Z79899 Other long term (current) drug therapy: Secondary | ICD-10-CM | POA: Insufficient documentation

## 2014-12-27 HISTORY — DX: Hyperlipidemia, unspecified: E78.5

## 2014-12-27 HISTORY — DX: Essential (primary) hypertension: I10

## 2014-12-27 MED ORDER — DICLOFENAC SODIUM 75 MG PO TBEC: 75.0000 mg | DELAYED_RELEASE_TABLET | Freq: Two times a day (BID) | ORAL | Status: DC

## 2014-12-27 MED ORDER — KETOROLAC TROMETHAMINE 10 MG PO TABS
10.0000 mg | ORAL_TABLET | Freq: Once | ORAL | Status: AC
  Administered 2014-12-27: 10 mg via ORAL
  Filled 2014-12-27: qty 1

## 2014-12-27 MED ORDER — DEXAMETHASONE 4 MG PO TABS: 4.0000 mg | ORAL_TABLET | Freq: Two times a day (BID) | ORAL | Status: DC

## 2014-12-27 NOTE — ED Provider Notes (Signed)
CSN: [id number]     Arrival date & time 12/27/14  1124 History  This chart was scribed for non-physician practitioner, Ivery Quale, PA-C working with Juliet Rude. Rubin Payor, MD by Gwenyth Ober, ED scribe. This patient was seen in room APFT21/APFT21 and the patient's care was started at 2:11 PM   Chief Complaint  Patient presents with  . Back Pain    Patient is a 59 y.o. male presenting with back pain. The history is provided by the patient. No language interpreter was used.  Back Pain Location:  Lumbar spine and sacro-iliac joint Quality:  Burning Radiates to:  R posterior upper leg and R thigh Pain severity:  Moderate Pain is:  Unable to specify Onset quality:  Gradual Duration:  3 days Timing:  Constant Chronicity:  Recurrent Context: not falling, not MVA, not occupational injury and not recent injury   Worsened by:  Sitting Associated symptoms: leg pain     HPI Comments: Mark Carpenter is a 59 y.o. male who presents to the Emergency Department complaining of recurrent, burning, right lower back pain that radiates to his right leg and started 3 days ago. Pt states difficulty ambulating due to pain. The pain becomes worse with sitting for long periods. Pt has history of similar symptoms that occurred 2 months ago and resolved without any treatment. He was not evaluated by a doctor for prior pain. Pt denies recent injuries, MVC and falls. He also denies prior back or hip surgeries.  Past Medical History  Diagnosis Date  . GERD (gastroesophageal reflux disease)   . Tobacco abuse     smoker 2 pk/day  . Hypertension   . Hyperlipemia    Past Surgical History  Procedure Laterality Date  . Appendectomy    . Coronary artery bypass graft N/A 05/04/2013    Procedure: CORONARY ARTERY BYPASS GRAFTING times five using Right Greater Saphenous Vein Graft harvested endoscopically and Left Internal Mammary Artery;  Surgeon: Kerin Perna, MD;  Location: [facility] OR;  Service: Open Heart Surgery;   Laterality: N/A;  . Intraoperative transesophageal echocardiogram N/A 05/04/2013    Procedure: INTRAOPERATIVE TRANSESOPHAGEAL ECHOCARDIOGRAM;  Surgeon: Kerin Perna, MD;  Location: [facility] OR;  Service: Open Heart Surgery;  Laterality: N/A;  . Left heart catheterization with coronary angiogram N/A 04/30/2013    Procedure: LEFT HEART CATHETERIZATION WITH CORONARY ANGIOGRAM;  Surgeon: Tonny Bollman, MD;  Location: [facility] CATH LAB;  Service: Cardiovascular;  Laterality: N/A;  . Intravascular ultrasound  04/30/2013    Procedure: INTRAVASCULAR ULTRASOUND;  Surgeon: Tonny Bollman, MD;  Location: [facility] CATH LAB;  Service: Cardiovascular;;   Family History  Problem Relation Age of Onset  . Adopted: Yes   History  Substance Use Topics  . Smoking status: Current Every Day Smoker -- 1.50 packs/day for 35 years    Types: Cigarettes  . Smokeless tobacco: Not on file  . Alcohol Use: No    Review of Systems  Musculoskeletal: Positive for back pain and gait problem. Negative for joint swelling.  Skin: Negative for wound.  All other systems reviewed and are negative.     Allergies  Review of patient's allergies indicates no known allergies.  Home Medications   Prior to Admission medications   Medication Sig Start Date End Date Taking? Authorizing Provider  aspirin EC 325 MG tablet Take 325 mg by mouth every morning.    Historical Provider, MD  atorvastatin (LIPITOR) 40 MG tablet Take 1 tablet (40 mg total) by mouth daily at 6 PM. 05/10/13  Erin Barrett, PA-C  budesonide-formoterol (SYMBICORT) 160-4.5 MCG/ACT inhaler Inhale 2 puffs into the lungs 2 (two) times daily. 05/10/13   Erin Barrett, PA-C  diphenhydramine-acetaminophen (TYLENOL PM) 25-500 MG TABS Take 2 tablets by mouth at bedtime.    Historical Provider, MD  HYDROcodone-acetaminophen (NORCO) 10-325 MG per tablet Take 1 tablet by mouth every 4 (four) hours as needed for pain. 05/28/13   Kerin Perna, MD  lisinopril (PRINIVIL,ZESTRIL) 10 MG tablet  Take 1 tablet (10 mg total) by mouth daily. 05/10/13   Erin Barrett, PA-C  metoprolol tartrate (LOPRESSOR) 25 MG tablet Take 0.5 tablets (12.5 mg total) by mouth 2 (two) times daily. 05/10/13   Erin Barrett, PA-C  omeprazole (PRILOSEC) 20 MG capsule Take 20 mg by mouth every morning.    Historical Provider, MD   BP 157/86 mmHg  Pulse 84  Temp(Src) 99.1 F (37.3 C) (Oral)  Resp 18  Ht  (1.803 m)  Wt 190 lb (86.183 kg)  BMI 26.51 kg/m2  SpO2 99% Physical Exam  Constitutional: He is oriented to person, place, and time. He appears well-developed and well-nourished. No distress.  HENT:  Head: Normocephalic and atraumatic.  Mouth/Throat: Oropharynx is clear and moist. No oropharyngeal exudate.  Eyes: Pupils are equal, round, and reactive to light.  Neck: Neck supple.  Cardiovascular: Normal rate.   Pulmonary/Chest: Effort normal.  Musculoskeletal: He exhibits no edema.  Pain to palpation posterior and lateral right hip  Neurological: He is alert and oriented to person, place, and time. No cranial nerve deficit.  No motor or sensory deficit; no foot drop  Skin: Skin is warm and dry. No rash noted.  Psychiatric: He has a normal mood and affect. His behavior is normal.  Nursing note and vitals reviewed.   ED Course  Procedures (including critical care time) DIAGNOSTIC STUDIES: Oxygen Saturation is 99% on RA, normal by my interpretation.    COORDINATION OF CARE: 2:20 PM Discussed treatment plan with pt which includes x-rays. Pt agreed to plan.   Labs Review Labs Reviewed - No data to display  Imaging Review No results found.   EKG Interpretation None      MDM  X-ray of the lumbar spine shows degenerative disease at the L2-L3 area. X-ray of the right hip and pelvis negative for fracture or dislocation. The examination supports arthritis of the right hip and several other joints.  Patient referred to orthopedics. Prescription for Decadron and Voltaren given to the  patient. Patient is to see the primary physician or return to the emergency department if any emergent changes, problems, or concerns.    Final diagnoses:  Back pain  Primary osteoarthritis involving multiple joints  DDD (degenerative disc disease), lumbar    *I have reviewed nursing notes, vital signs, and all appropriate lab and imaging results for this patient.** **I personally performed the services described in this documentation, which was scribed in my presence. The recorded information has been reviewed and is accurate.Kathie Dike, PA-C 12/31/14 1401  Juliet Rude. Rubin Payor, MD 12/31/14 2356

## 2014-12-27 NOTE — ED Notes (Signed)
PT reports lower back pain that radiates into his right leg x2 days. Pt denies any urinary symptoms. PT ambultory in triage and denies any recent injury.

## 2014-12-27 NOTE — Discharge Instructions (Signed)
Back Pain, Adult Low back pain is very common. About 1 in 5 people have back pain.The cause of low back pain is rarely dangerous. The pain often gets better over time.About half of people with a sudden onset of back pain feel better in just 2 weeks. About 8 in 10 people feel better by 6 weeks.  CAUSES Some common causes of back pain include:  Strain of the muscles or ligaments supporting the spine.  Wear and tear (degeneration) of the spinal discs.  Arthritis.  Direct injury to the back. DIAGNOSIS Most of the time, the direct cause of low back pain is not known.However, back pain can be treated effectively even when the exact cause of the pain is unknown.Answering your caregiver's questions about your overall health and symptoms is one of the most accurate ways to make sure the cause of your pain is not dangerous. If your caregiver needs more information, he or she may order lab work or imaging tests (X-rays or MRIs).However, even if imaging tests show changes in your back, this usually does not require surgery. HOME CARE INSTRUCTIONS For many people, back pain returns.Since low back pain is rarely dangerous, it is often a condition that people can learn to [facility] their own.   Remain active. It is stressful on the back to sit or stand in one place. Do not sit, drive, or stand in one place for more than 30 minutes at a time. Take short walks on level surfaces as soon as pain allows.Try to increase the length of time you walk each day.  Do not stay in bed.Resting more than 1 or 2 days can delay your recovery.  Do not avoid exercise or work.Your body is made to move.It is not dangerous to be active, even though your back may hurt.Your back will likely heal faster if you return to being active before your pain is gone.  Pay attention to your body when you bend and lift. Many people have less discomfortwhen lifting if they bend their knees, keep the load close to their bodies,and  avoid twisting. Often, the most comfortable positions are those that put less stress on your recovering back.  Find a comfortable position to sleep. Use a firm mattress and lie on your side with your knees slightly bent. If you lie on your back, put a pillow under your knees.  Only take over-the-counter or prescription medicines as directed by your caregiver. Over-the-counter medicines to reduce pain and inflammation are often the most helpful.Your caregiver may prescribe muscle relaxant drugs.These medicines help dull your pain so you can more quickly return to your normal activities and healthy exercise.  Put ice on the injured area.  Put ice in a plastic bag.  Place a towel between your skin and the bag.  Leave the ice on for 15-20 minutes, 03-04 times a day for the first 2 to 3 days. After that, ice and heat may be alternated to reduce pain and spasms.  Ask your caregiver about trying back exercises and gentle massage. This may be of some benefit.  Avoid feeling anxious or stressed.Stress increases muscle tension and can worsen back pain.It is important to recognize when you are anxious or stressed and learn ways to manage it.Exercise is a great option. SEEK MEDICAL CARE IF:  You have pain that is not relieved with rest or medicine.  You have pain that does not improve in 1 week.  You have new symptoms.  You are generally not feeling well. SEEK  IMMEDIATE MEDICAL CARE IF:   You have pain that radiates from your back into your legs.  You develop new bowel or bladder control problems.  You have unusual weakness or numbness in your arms or legs.  You develop nausea or vomiting.  You develop abdominal pain.  You feel faint. Document Released: 12/10/2005 Document Revised: 06/10/2012 Document Reviewed: 04/13/2014 [facility] Patient Information 2015 [place], [place]. This information is not intended to replace advice given to you by your health care provider. Make sure you  discuss any questions you have with your health care provider.  Arthritis, Nonspecific Arthritis is inflammation of a joint. This usually means pain, redness, warmth or swelling are present. One or more joints may be involved. There are a number of types of arthritis. Your caregiver may not be able to tell what type of arthritis you have right away. CAUSES  The most common cause of arthritis is the wear and tear on the joint (osteoarthritis). This causes damage to the cartilage, which can break down over time. The knees, hips, back and neck are most often affected by this type of arthritis. Other types of arthritis and common causes of joint pain include:  Sprains and other injuries near the joint. Sometimes minor sprains and injuries cause pain and swelling that develop hours later.  Rheumatoid arthritis. This affects hands, feet and knees. It usually affects both sides of your body at the same time. It is often associated with chronic ailments, fever, weight loss and general weakness.  Crystal arthritis. Gout and pseudo gout can cause occasional acute severe pain, redness and swelling in the foot, ankle, or knee.  Infectious arthritis. Bacteria can get into a joint through a break in overlying skin. This can cause infection of the joint. Bacteria and viruses can also spread through the blood and affect your joints.  Drug, infectious and allergy reactions. Sometimes joints can become mildly painful and slightly swollen with these types of illnesses. SYMPTOMS   Pain is the main symptom.  Your joint or joints can also be red, swollen and warm or hot to the touch.  You may have a fever with certain types of arthritis, or even feel overall ill.  The joint with arthritis will hurt with movement. Stiffness is present with some types of arthritis. DIAGNOSIS  Your caregiver will suspect arthritis based on your description of your symptoms and on your exam. Testing may be needed to find the type  of arthritis:  Blood and sometimes urine tests.  X-ray tests and sometimes CT or MRI scans.  Removal of fluid from the joint (arthrocentesis) is done to check for bacteria, crystals or other causes. Your caregiver (or a specialist) will numb the area over the joint with a local anesthetic, and use a needle to remove joint fluid for examination. This procedure is only minimally uncomfortable.  Even with these tests, your caregiver may not be able to tell what kind of arthritis you have. Consultation with a specialist (rheumatologist) may be helpful. TREATMENT  Your caregiver will discuss with you treatment specific to your type of arthritis. If the specific type cannot be determined, then the following general recommendations may apply. Treatment of severe joint pain includes:  Rest.  Elevation.  Anti-inflammatory medication (for example, ibuprofen) may be prescribed. Avoiding activities that cause increased pain.  Only take over-the-counter or prescription medicines for pain and discomfort as recommended by your caregiver.  Cold packs over an inflamed joint may be used for 10 to 15 minutes every  hour. Hot packs sometimes feel better, but do not use overnight. Do not use hot packs if you are diabetic without your caregiver's permission.  A cortisone shot into arthritic joints may help reduce pain and swelling.  Any acute arthritis that gets worse over the next 1 to 2 days needs to be looked at to be sure there is no joint infection. Long-term arthritis treatment involves modifying activities and lifestyle to reduce joint stress jarring. This can include weight loss. Also, exercise is needed to nourish the joint cartilage and remove waste. This helps keep the muscles around the joint strong. HOME CARE INSTRUCTIONS   Do not take aspirin to relieve pain if gout is suspected. This elevates uric acid levels.  Only take over-the-counter or prescription medicines for pain, discomfort or fever  as directed by your caregiver.  Rest the joint as much as possible.  If your joint is swollen, keep it elevated.  Use crutches if the painful joint is in your leg.  Drinking plenty of fluids may help for certain types of arthritis.  Follow your caregiver's dietary instructions.  Try low-impact exercise such as:  Swimming.  Water aerobics.  Biking.  Walking.  Morning stiffness is often relieved by a warm shower.  Put your joints through regular range-of-motion. SEEK MEDICAL CARE IF:   You do not feel better in 24 hours or are getting worse.  You have side effects to medications, or are not getting better with treatment. SEEK IMMEDIATE MEDICAL CARE IF:   You have a fever.  You develop severe joint pain, swelling or redness.  Many joints are involved and become painful and swollen.  There is severe back pain and/or leg weakness.  You have loss of bowel or bladder control. Document Released: 01/17/2005 Document Revised: 03/03/2012 Document Reviewed: 02/02/2009 [facility] Patient Information 2015 [place], [place]. This information is not intended to replace advice given to you by your health care provider. Make sure you discuss any questions you have with your health care provider.

## 2015-02-23 IMAGING — CR DG HIP (WITH OR WITHOUT PELVIS) 1V*R*
3 series · 3 of 3 positions shown · non-contrast
Comparison: None.

CLINICAL DATA: Lower back pain. Right hip pain shooting down the
right leg for 3 days. Same symptoms 3 months ago. No known injury.

EXAM:
RIGHT HIP

[view not recorded (1 of 3)]
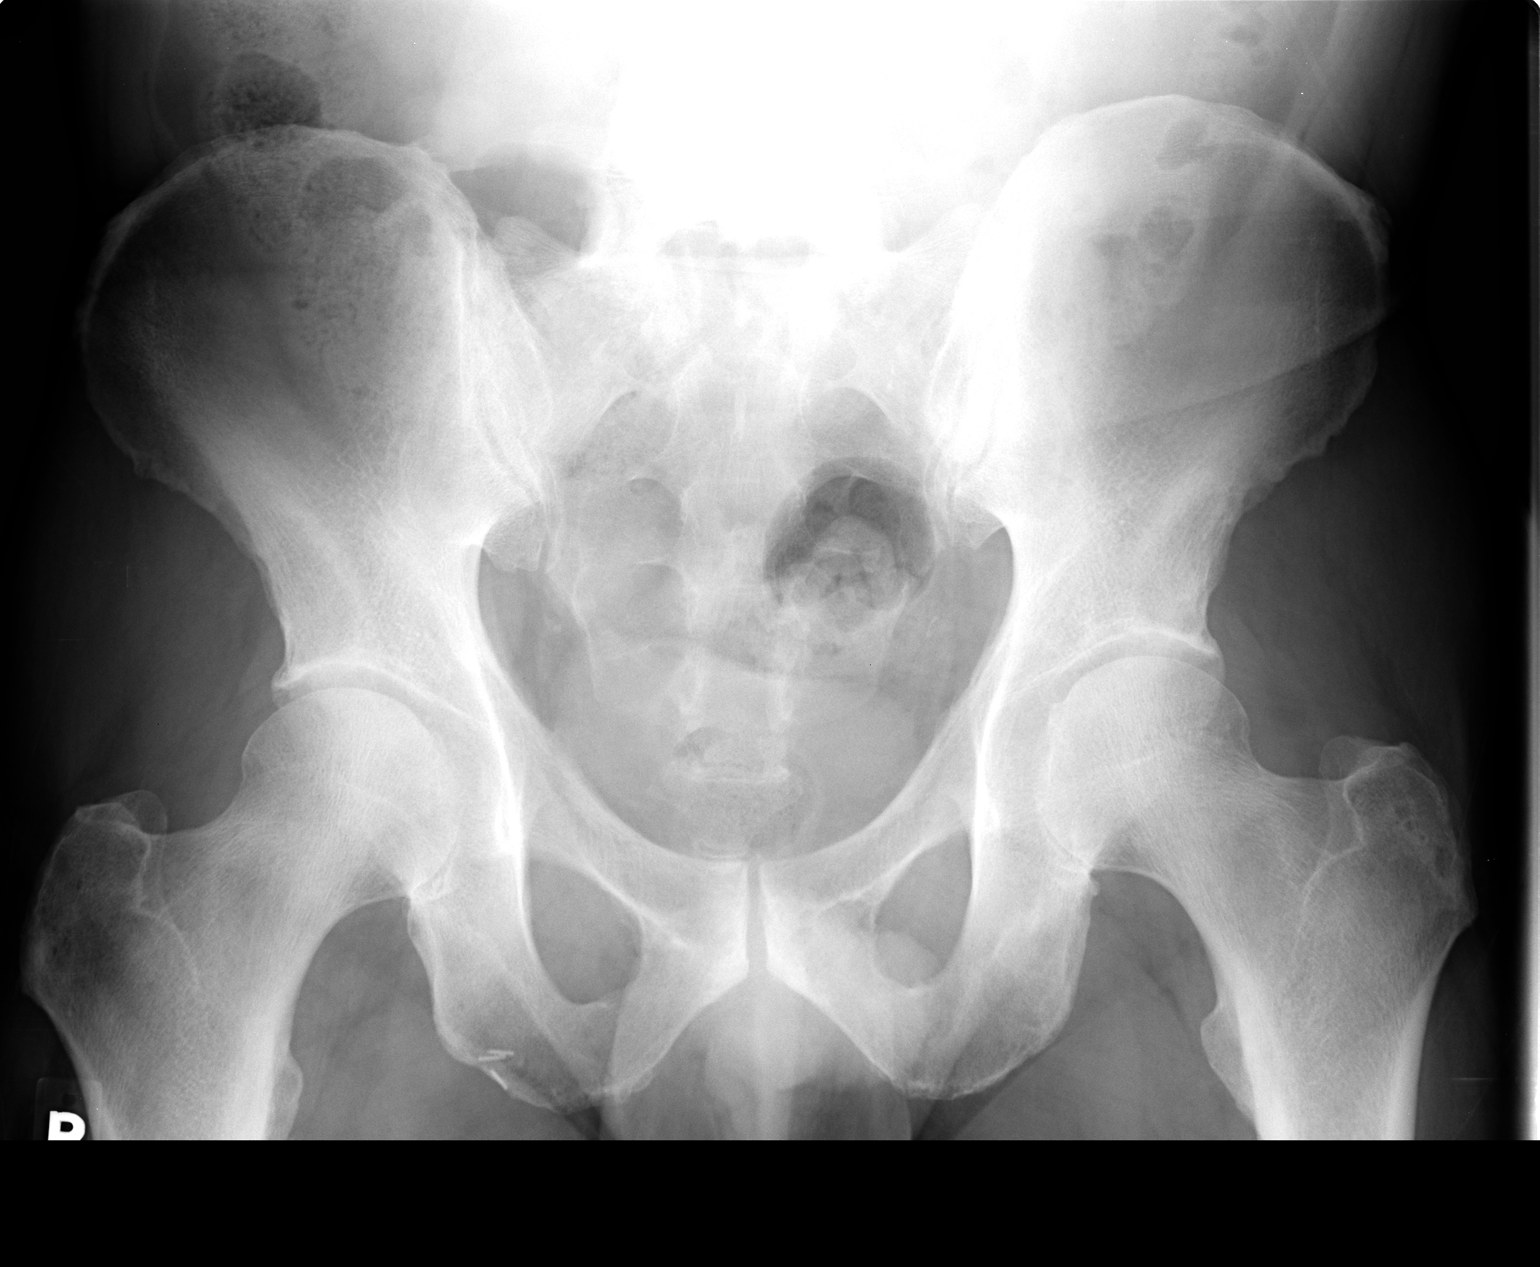

[view not recorded (2 of 3)]
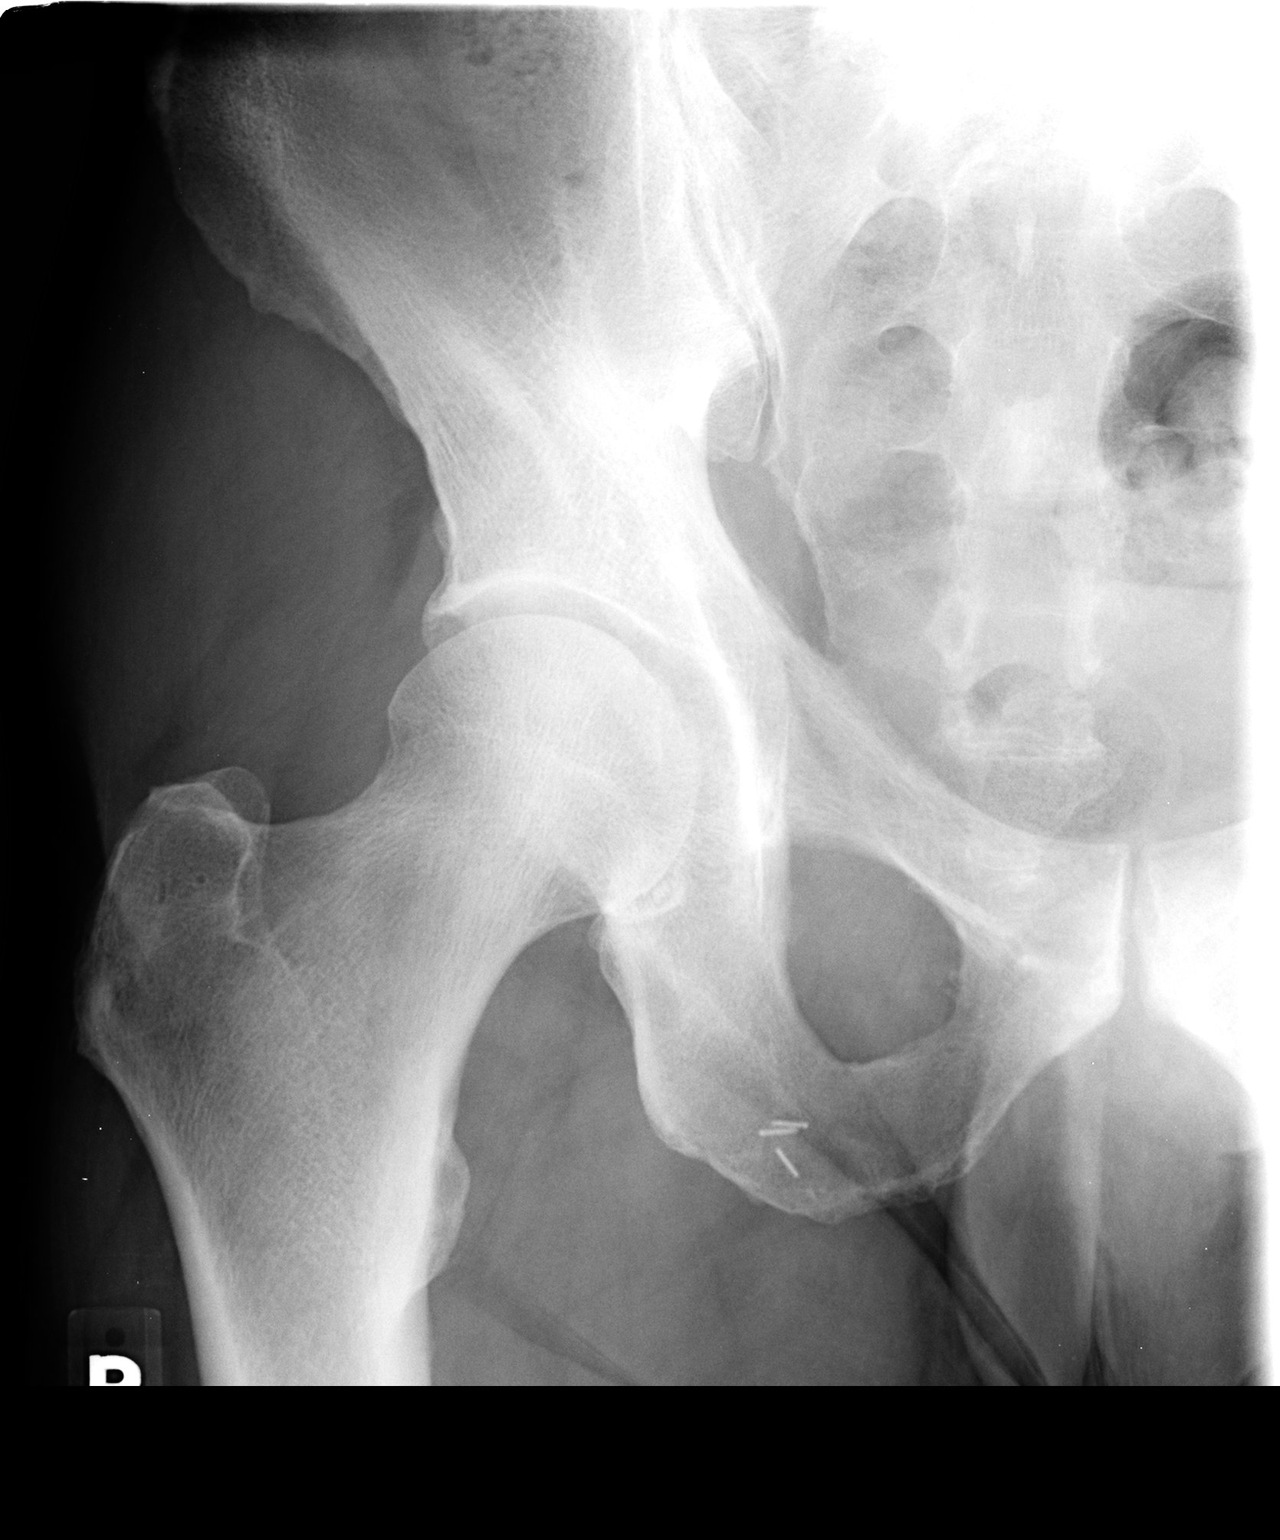

[view not recorded (3 of 3)]
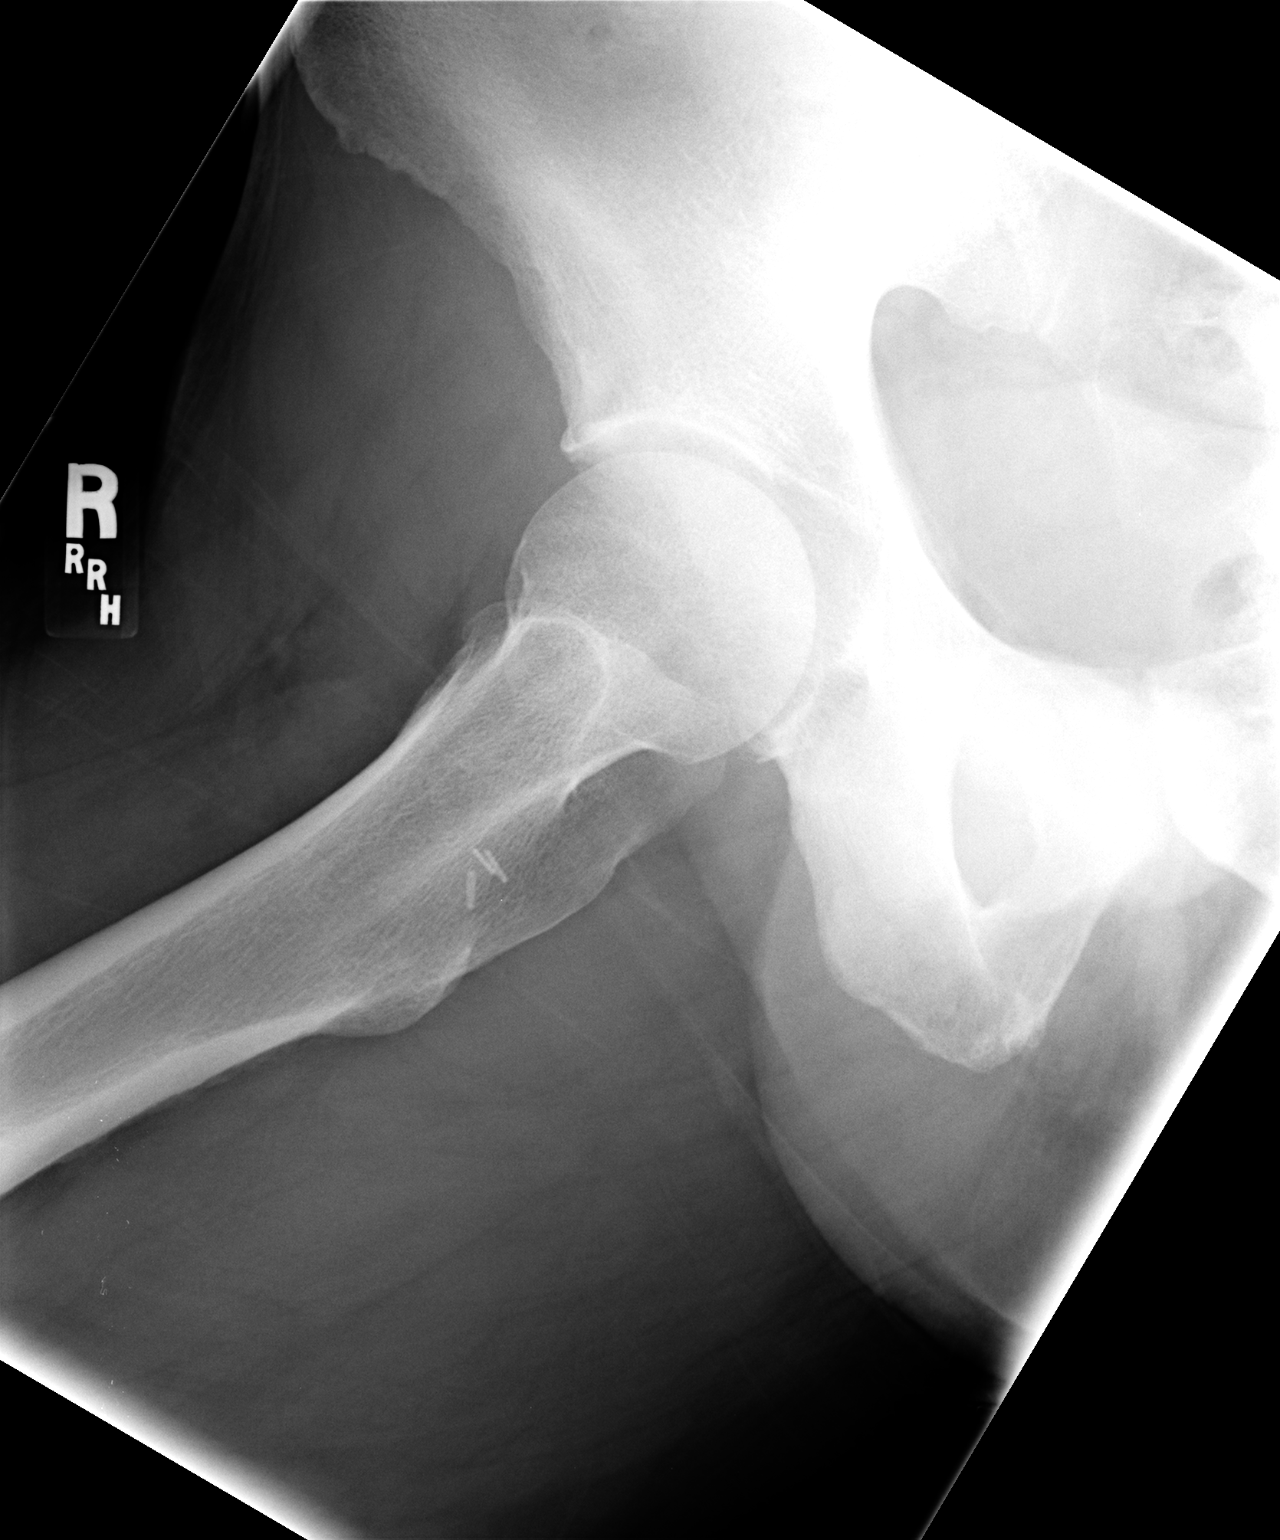

[3 of 3 positions shown; findings below may reference images not displayed]

FINDINGS: There is no evidence of hip fracture or dislocation. There is no
evidence of arthropathy or other focal bone abnormality. Vascular
clips are identified in the right inguinal region.
IMPRESSION: Negative.

## 2015-05-19 ENCOUNTER — Ambulatory Visit (HOSPITAL_COMMUNITY)
Admission: RE | Admit: 2015-05-19 | Discharge: 2015-05-19 | Disposition: A | Discharge status: Home / Self Care | Payer: PRIVATE HEALTH INSURANCE | Source: Ambulatory Visit | Attending: Specialist | Admitting: Specialist

## 2015-05-19 ENCOUNTER — Other Ambulatory Visit (HOSPITAL_COMMUNITY): Payer: Self-pay | Admitting: Specialist

## 2015-05-19 DIAGNOSIS — I6523 Occlusion and stenosis of bilateral carotid arteries: Secondary | ICD-10-CM | POA: Insufficient documentation

## 2015-05-19 DIAGNOSIS — G453 Amaurosis fugax: Secondary | ICD-10-CM

## 2015-05-19 NOTE — Progress Notes (Signed)
Carotid Duplex Completed. 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  Marilynne Halstedita Jennefer Kopp, BS, RDMS, RVT

## 2015-07-06 ENCOUNTER — Encounter: Payer: Self-pay | Admitting: Vascular Surgery

## 2015-07-08 ENCOUNTER — Ambulatory Visit: Payer: PRIVATE HEALTH INSURANCE | Admitting: Vascular Surgery

## 2015-07-11 ENCOUNTER — Encounter: Payer: Self-pay | Admitting: Vascular Surgery

## 2015-07-13 ENCOUNTER — Ambulatory Visit: Payer: PRIVATE HEALTH INSURANCE | Admitting: Vascular Surgery

## 2015-12-15 ENCOUNTER — Encounter (HOSPITAL_COMMUNITY): Payer: Self-pay | Admitting: *Deleted

## 2015-12-15 ENCOUNTER — Emergency Department (HOSPITAL_COMMUNITY)
Admission: EM | Admit: 2015-12-15 | Discharge: 2015-12-15 | Discharge status: Against Medical Advice | Payer: Medicare (Managed Care) | Attending: Emergency Medicine | Admitting: Emergency Medicine

## 2015-12-15 DIAGNOSIS — I2510 Atherosclerotic heart disease of native coronary artery without angina pectoris: Secondary | ICD-10-CM | POA: Diagnosis not present

## 2015-12-15 DIAGNOSIS — I10 Essential (primary) hypertension: Secondary | ICD-10-CM | POA: Diagnosis not present

## 2015-12-15 DIAGNOSIS — Z760 Encounter for issue of repeat prescription: Secondary | ICD-10-CM | POA: Insufficient documentation

## 2015-12-15 DIAGNOSIS — F17210 Nicotine dependence, cigarettes, uncomplicated: Secondary | ICD-10-CM | POA: Diagnosis not present

## 2015-12-15 HISTORY — DX: Atherosclerotic heart disease of native coronary artery without angina pectoris: I25.10

## 2015-12-15 NOTE — ED Notes (Signed)
Pt states no complaint, states that he only  Needs refills. States he has no PCP. Refill for Clopidogrel 75mg  daily, Lisinopril 10mg  daily, and Metoprolol ER 25mg  1/2 tab twice daily. Out of meds x 2 days.

## 2015-12-15 NOTE — ED Notes (Signed)
Pt states that he [place] dentist [place] to go to in [place] and had to leave.  Pt given names and numbers for primary care physicians to follow up with.

## 2016-01-29 ENCOUNTER — Emergency Department (HOSPITAL_COMMUNITY)
Admission: EM | Admit: 2016-01-29 | Discharge: 2016-01-29 | Disposition: A | Discharge status: Home / Self Care | Payer: PRIVATE HEALTH INSURANCE | Attending: Emergency Medicine | Admitting: Emergency Medicine

## 2016-01-29 ENCOUNTER — Encounter (HOSPITAL_COMMUNITY): Payer: Self-pay | Admitting: *Deleted

## 2016-01-29 DIAGNOSIS — F17210 Nicotine dependence, cigarettes, uncomplicated: Secondary | ICD-10-CM | POA: Diagnosis not present

## 2016-01-29 DIAGNOSIS — Z9889 Other specified postprocedural states: Secondary | ICD-10-CM | POA: Diagnosis not present

## 2016-01-29 DIAGNOSIS — Z7952 Long term (current) use of systemic steroids: Secondary | ICD-10-CM | POA: Diagnosis not present

## 2016-01-29 DIAGNOSIS — Z7951 Long term (current) use of inhaled steroids: Secondary | ICD-10-CM | POA: Diagnosis not present

## 2016-01-29 DIAGNOSIS — I10 Essential (primary) hypertension: Secondary | ICD-10-CM | POA: Insufficient documentation

## 2016-01-29 DIAGNOSIS — Z760 Encounter for issue of repeat prescription: Secondary | ICD-10-CM | POA: Diagnosis present

## 2016-01-29 DIAGNOSIS — I2510 Atherosclerotic heart disease of native coronary artery without angina pectoris: Secondary | ICD-10-CM | POA: Insufficient documentation

## 2016-01-29 DIAGNOSIS — K219 Gastro-esophageal reflux disease without esophagitis: Secondary | ICD-10-CM | POA: Insufficient documentation

## 2016-01-29 DIAGNOSIS — E785 Hyperlipidemia, unspecified: Secondary | ICD-10-CM | POA: Diagnosis not present

## 2016-01-29 DIAGNOSIS — Z79899 Other long term (current) drug therapy: Secondary | ICD-10-CM | POA: Diagnosis not present

## 2016-01-29 DIAGNOSIS — Z951 Presence of aortocoronary bypass graft: Secondary | ICD-10-CM | POA: Diagnosis not present

## 2016-01-29 DIAGNOSIS — Z791 Long term (current) use of non-steroidal anti-inflammatories (NSAID): Secondary | ICD-10-CM | POA: Insufficient documentation

## 2016-01-29 DIAGNOSIS — Z7982 Long term (current) use of aspirin: Secondary | ICD-10-CM | POA: Insufficient documentation

## 2016-01-29 MED ORDER — LISINOPRIL 10 MG PO TABS: 10.0000 mg | ORAL_TABLET | Freq: Every day | ORAL | Status: DC

## 2016-01-29 MED ORDER — CLOPIDOGREL BISULFATE 75 MG PO TABS: 75.0000 mg | ORAL_TABLET | Freq: Every day | ORAL | Status: DC

## 2016-01-29 MED ORDER — METOPROLOL TARTRATE 25 MG PO TABS: 12.5000 mg | ORAL_TABLET | Freq: Two times a day (BID) | ORAL | Status: DC

## 2016-01-29 NOTE — ED Provider Notes (Signed)
CSN: [id number]     Arrival date & time 01/29/16  1145 History  By signing my name below, I, Elon Spanner, attest that this documentation has been prepared under the direction and in the presence of Trisha Mangle, PA-C. Electronically Signed: Elon Spanner ED Scribe. 01/29/2016. 12:18 PM.   Chief Complaint  Patient presents with  . Medication Refill   The history is provided by the patient. No language interpreter was used.   HPI Comments: Mark Carpenter is a 60 y.o. male with hx of HTN, CAD, HLD who presents to the Emergency Department for refill of Lisonopril, Plavix and Metoprolol after running out 3 months ago.  The wife states "its just hard getting him to go the doctor."  He has taken these medications for several years without complication.  The patient is rx'd Plavix stemming from a 2014 bypass.  He states his HTN is well-controlled with these medications.  Patient is a current smoker.      Past Medical History  Diagnosis Date  . GERD (gastroesophageal reflux disease)   . Tobacco abuse     smoker 2 pk/day  . Hypertension   . Hyperlipemia   . Coronary artery disease    Past Surgical History  Procedure Laterality Date  . Appendectomy    . Coronary artery bypass graft N/A 05/04/2013    Procedure: CORONARY ARTERY BYPASS GRAFTING times five using Right Greater Saphenous Vein Graft harvested endoscopically and Left Internal Mammary Artery;  Surgeon: Kerin Perna, MD;  Location: [facility] OR;  Service: Open Heart Surgery;  Laterality: N/A;  . Intraoperative transesophageal echocardiogram N/A 05/04/2013    Procedure: INTRAOPERATIVE TRANSESOPHAGEAL ECHOCARDIOGRAM;  Surgeon: Kerin Perna, MD;  Location: [facility] OR;  Service: Open Heart Surgery;  Laterality: N/A;  . Left heart catheterization with coronary angiogram N/A 04/30/2013    Procedure: LEFT HEART CATHETERIZATION WITH CORONARY ANGIOGRAM;  Surgeon: Tonny Bollman, MD;  Location: [facility] CATH LAB;  Service: Cardiovascular;  Laterality: N/A;  .  Intravascular ultrasound  04/30/2013    Procedure: INTRAVASCULAR ULTRASOUND;  Surgeon: Tonny Bollman, MD;  Location: [facility] CATH LAB;  Service: Cardiovascular;;   Family History  Problem Relation Age of Onset  . Adopted: Yes   Social History  Substance Use Topics  . Smoking status: Current Every Day Smoker -- 1.50 packs/day for 35 years    Types: Cigarettes  . Smokeless tobacco: None  . Alcohol Use: No    Review of Systems A complete 10 system review of systems was obtained and all systems are negative except as noted in the HPI and PMH.   Allergies  Review of patient's allergies indicates no known allergies.  Home Medications   Prior to Admission medications   Medication Sig Start Date End Date Taking? Authorizing Provider  aspirin EC 325 MG tablet Take 325 mg by mouth every morning.    Historical Provider, MD  atorvastatin (LIPITOR) 40 MG tablet Take 1 tablet (40 mg total) by mouth daily at 6 PM. 05/10/13   Erin R Barrett, PA-C  budesonide-formoterol (SYMBICORT) 160-4.5 MCG/ACT inhaler Inhale 2 puffs into the lungs 2 (two) times daily. 05/10/13   Erin R Barrett, PA-C  dexamethasone (DECADRON) 4 MG tablet Take 1 tablet (4 mg total) by mouth 2 (two) times daily with a meal. 12/27/14   Ivery Quale, PA-C  diclofenac (VOLTAREN) 75 MG EC tablet Take 1 tablet (75 mg total) by mouth 2 (two) times daily. 12/27/14   Ivery Quale, PA-C  diphenhydramine-acetaminophen (TYLENOL PM) 25-500 MG  TABS Take 2 tablets by mouth at bedtime.    Historical Provider, MD  HYDROcodone-acetaminophen (NORCO) 10-325 MG per tablet Take 1 tablet by mouth every 4 (four) hours as needed for pain. Patient not taking: Reported on 12/27/2014 05/28/13   Kerin Perna, MD  ibuprofen (ADVIL,MOTRIN) 200 MG tablet Take 1,000 mg by mouth every 8 (eight) hours as needed (pain).    Historical Provider, MD  lisinopril (PRINIVIL,ZESTRIL) 10 MG tablet Take 1 tablet (10 mg total) by mouth daily. 05/10/13   Erin R Barrett, PA-C  metoprolol  tartrate (LOPRESSOR) 25 MG tablet Take 0.5 tablets (12.5 mg total) by mouth 2 (two) times daily. 05/10/13   Erin R Barrett, PA-C  omeprazole (PRILOSEC) 20 MG capsule Take 20 mg by mouth every morning.    Historical Provider, MD   BP 192/88 mmHg  Pulse 81  Temp(Src) 98.3 F (36.8 C) (Oral)  Resp 18  Ht  (1.803 m)  Wt 188 lb (85.276 kg)  BMI 26.23 kg/m2  SpO2 99% Physical Exam  Constitutional: He is oriented to person, place, and time. He appears well-developed and well-nourished. No distress.  HENT:  Head: Normocephalic and atraumatic.  Nose: Nose normal.  Eyes: Conjunctivae and EOM are normal.  Neck: Neck supple. No tracheal deviation present.  Cardiovascular: Normal rate.   Pulmonary/Chest: Effort normal. No respiratory distress.  Musculoskeletal: Normal range of motion.  Neurological: He is alert and oriented to person, place, and time.  Skin: Skin is warm and dry.  Psychiatric: He has a normal mood and affect. His behavior is normal.  Nursing note and vitals reviewed.   ED Course  Procedures (including critical care time)  DIAGNOSTIC STUDIES: Oxygen Saturation is 99% on RA, normal by my interpretation.    COORDINATION OF CARE:  12:21 PM Will refill medications.  Patient understands he needs to f/u with PCP.  Patient acknowledges and agrees with plan.   Labs Review Labs Reviewed - No data to display  Imaging Review No results found. I have personally reviewed and evaluated these images and lab results as part of my medical decision-making.   EKG Interpretation None      MDM   Final diagnoses:  Essential hypertension    Meds ordered this encounter  Medications  . metoprolol tartrate (LOPRESSOR) 25 MG tablet    Sig: Take 0.5 tablets (12.5 mg total) by mouth 2 (two) times daily.    Dispense:  60 tablet    Refill:  3    Order Specific Question:  Supervising Provider    Answer:  Hyacinth Meeker, BRIAN [[id number]]  . lisinopril (PRINIVIL,ZESTRIL) 10 MG tablet     Sig: Take 1 tablet (10 mg total) by mouth daily.    Dispense:  30 tablet    Refill:  3    Order Specific Question:  Supervising Provider    Answer:  Hyacinth Meeker, BRIAN [[id number]]  . clopidogrel (PLAVIX) 75 MG tablet    Sig: Take 1 tablet (75 mg total) by mouth daily.    Dispense:  30 tablet    Refill:  3    Order Specific Question:  Supervising Provider    Answer:  Eber Hong [[id number]]   An After Visit Summary was printed and given to the patient.  Lonia Skinner Beckemeyer, PA-C 01/29/16 1227  Doug Sou, MD 01/29/16 [phone]

## 2016-01-29 NOTE — Discharge Instructions (Signed)
Hypertension °Hypertension, commonly called high blood pressure, is when the force of blood pumping through your arteries is too strong. Your arteries are the blood vessels that carry blood from your heart throughout your body. A blood pressure reading consists of a higher number over a lower number, such as 110/72. The higher number (systolic) is the pressure inside your arteries when your heart pumps. The lower number (diastolic) is the pressure inside your arteries when your heart relaxes. Ideally you want your blood pressure below 120/80. °Hypertension forces your heart to work harder to pump blood. Your arteries may become narrow or stiff. Having untreated or uncontrolled hypertension can cause heart attack, stroke, kidney disease, and other problems. °RISK FACTORS °Some risk factors for high blood pressure are controllable. Others are not.  °Risk factors you cannot control include:  °· Race. You may be at higher risk if you are African American. °· Age. Risk increases with age. °· Gender. Men are at higher risk than women before age 45 years. After age 65, women are at higher risk than men. °Risk factors you can control include: °· Not getting enough exercise or physical activity. °· Being overweight. °· Getting too much fat, sugar, calories, or salt in your diet. °· Drinking too much alcohol. °SIGNS AND SYMPTOMS °Hypertension does not usually cause signs or symptoms. Extremely high blood pressure (hypertensive crisis) may cause headache, anxiety, shortness of breath, and nosebleed. °DIAGNOSIS °To check if you have hypertension, your health care provider will measure your blood pressure while you are seated, with your arm held at the level of your heart. It should be measured at least twice using the same arm. Certain conditions can cause a difference in blood pressure between your right and left arms. A blood pressure reading that is higher than normal on one occasion does not mean that you need treatment. If  it is not clear whether you have high blood pressure, you may be asked to return on a different day to have your blood pressure checked again. Or, you may be asked to monitor your blood pressure at home for 1 or more weeks. °TREATMENT °Treating high blood pressure includes making lifestyle changes and possibly taking medicine. Living a healthy lifestyle can help lower high blood pressure. You may need to change some of your habits. °Lifestyle changes may include: °· Following the DASH diet. This diet is high in fruits, vegetables, and whole grains. It is low in salt, red meat, and added sugars. °· Keep your sodium intake below 2,300 mg per day. °· Getting at least 30-45 minutes of aerobic exercise at least 4 times per week. °· Losing weight if necessary. °· Not smoking. °· Limiting alcoholic beverages. °· Learning ways to reduce stress. °Your health care provider may prescribe medicine if lifestyle changes are not enough to get your blood pressure under control, and if one of the following is true: °· You are 18-59 years of age and your systolic blood pressure is above 140. °· You are 60 years of age or older, and your systolic blood pressure is above 150. °· Your diastolic blood pressure is above 90. °· You have diabetes, and your systolic blood pressure is over 140 or your diastolic blood pressure is over 90. °· You have kidney disease and your blood pressure is above 140/90. °· You have heart disease and your blood pressure is above 140/90. °Your personal target blood pressure may vary depending on your medical conditions, your age, and other factors. °HOME CARE INSTRUCTIONS °·   Have your blood pressure rechecked as directed by your health care provider.   °· Take medicines only as directed by your health care provider. Follow the directions carefully. Blood pressure medicines must be taken as prescribed. The medicine does not work as well when you skip doses. Skipping doses also puts you at risk for  problems. °· Do not smoke.   °· Monitor your blood pressure at home as directed by your health care provider.  °SEEK MEDICAL CARE IF:  °· You think you are having a reaction to medicines taken. °· You have recurrent headaches or feel dizzy. °· You have swelling in your ankles. °· You have trouble with your vision. °SEEK IMMEDIATE MEDICAL CARE IF: °· You develop a severe headache or confusion. °· You have unusual weakness, numbness, or feel faint. °· You have severe chest or abdominal pain. °· You vomit repeatedly. °· You have trouble breathing. °MAKE SURE YOU:  °· Understand these instructions. °· Will watch your condition. °· Will get help right away if you are not doing well or get worse. °  °This information is not intended to replace advice given to you by your health care provider. Make sure you discuss any questions you have with your health care provider. °  °Document Released: 12/10/2005 Document Revised: 04/26/2015 Document Reviewed: 10/02/2013 °Elsevier Interactive Patient Education ©2016 Elsevier Inc. ° ° °Emergency Department Resource Guide °1) Find a Doctor and Pay Out of Pocket °Although you won't have to find out who is covered by your insurance plan, it is a good idea to ask around and get recommendations. You will then need to call the office and see if the doctor you have chosen will accept you as a new patient and what types of options they offer for patients who are self-pay. Some doctors offer discounts or will set up payment plans for their patients who do not have insurance, but you will need to ask so you aren't surprised when you get to your appointment. ° °2) Contact Your Local Health Department °Not all health departments have doctors that can see patients for sick visits, but many do, so it is worth a call to see if yours does. If you don't know where your local health department is, you can check in your phone book. The CDC also has a tool to help you locate your state's health  department, and many state websites also have listings of all of their local health departments. ° °3) Find a Walk-in Clinic °If your illness is not likely to be very severe or complicated, you may want to try a walk in clinic. These are popping up all over the country in pharmacies, drugstores, and shopping centers. They're usually staffed by nurse practitioners or physician assistants that have been trained to treat common illnesses and complaints. They're usually fairly quick and inexpensive. However, if you have serious medical issues or chronic medical problems, these are probably not your best option. ° °No Primary Care Doctor: °- Call [facility] Connect at  832-8000 - they can help you locate a primary care doctor that  accepts your insurance, provides certain services, etc. °- Physician Referral Service- 1-800-533-3463 ° °Chronic Pain Problems: °Organization         Address  Phone   Notes  °[facility]  (336) 297-2271 Patients need to be referred by their primary care doctor.  ° °Medication Assistance: °Organization         Address  Phone   Notes  °[place] [facility] [address] °[place], [place] [address] (336) 641-8030 --Must be a resident of [place] °-- Must have NO insurance coverage whatsoever (no Medicaid/ Medicare, etc.) °-- The pt. MUST have a primary care doctor that directs their care regularly and follows them in the community °  °[facility]  (866) 331-1348   °[employer]  (888) 892-1162   ° °Agencies that provide inexpensive medical care: °Organization         Address  Phone   Notes  °[facility]  (336) 832-8035   °[facility]    (336) 832-7272   °[facility] [address] °[place], [place] [address] (336) 832-4777   °[facility] [address], °[place] (336) 271-4999   °[facility]    (336) 373-0678   °[facility]    (336) 272-1050    °[facility] ° [address], [place] Phone:  (336) 832-4444, Fax:  (336) 832-4440 Hours of Operation:  9 am - 6 pm, M-F.  Also accepts Medicaid/Medicare and self-pay.  °[facility] ° [address], [place] Phone: (336) [phone], Fax: (336) [phone]. Hours of Operation:  8:30 am - 5:30 pm, M-F.  Also accepts Medicaid and self-pay.  °[facility] [place] [address], [place] Phone: (336) 878-6027   °[facility] [address], [place], [place] (336)723-1848, Ext. 123 Mondays & Thursdays: 7-9 AM.  First 15 patients are seen on a first come, first serve basis. °  ° °Medicaid-accepting [place] Providers: ° °Organization         Address  Phone   Notes  °[facility] [address], [place] (336) 641-2100 Also accepts self-pay patients.  °[facility] [address], [place] ° (336) 856-9996   °[facility] [address], [place] (336) 288-8857   °[facility] [address] [place] [address], [place] (336) 299-7000   °Veita Bland [address], [place]  ° (336) 373-1557 Only accepts [employer] Access Medicaid patients after they have their name applied to their card.  ° °Self-Pay (no insurance) in [place]: ° °Organization         Address  Phone   Notes  °Sickle Cell Patients, [facility] [address], [place] (336) 832-1970   °[facility] [address], [place] (336) 832-4400   °[facility] ° [address], [place] (336) 992-4800   °[facility]/Dr. Osei-Bonsu ° [address] [place] [address], [place] or [address], [place] (336) [phone] Phone number for both [place] and [place] locations is the same.  °[facility] [address], [place] (336) 299-0000    °[facility] [address] [place] [address], [place] or [address] (336) 852-7530 °(336) 878-2260   °[facility] [address], [place] (336) 350-1642, phone; (336) 294-5005, fax Sees patients 1st and 3rd Saturday of every month.  Must not qualify for public or private insurance (i.e. Medicaid, Medicare, [place] Health Choice, Veterans' Benefits) • Household income should be no more than 200% of the poverty level •The clinic cannot treat you if you are pregnant or think you are pregnant • Sexually transmitted diseases are not treated at   the clinic.  ° ° °Dental Care: °Organization         Address  Phone  Notes  °[facility] [address], [place] (336) 641-6152 Accepts children up to age 21 who are enrolled in Medicaid or [place] Health Choice; pregnant women with a Medicaid card; and children who have applied for Medicaid or [place] Health Choice, but were declined, whose parents can pay a reduced fee at time of service.  °[facility] [place]  [address], [place] (336) 641-7733 Accepts children up to age 21 who are enrolled in Medicaid or [place] Health Choice; pregnant women with a Medicaid card; and children who have applied for Medicaid or [place] Health Choice, but were declined, whose parents can pay a reduced fee at time of service.  °[facility] ° [address], [place] (336) 641-4533 Patients are seen by appointment only. Walk-ins are not accepted. [facility] will see patients 18 years of age and older. °Monday - Tuesday (8am-5pm) °Most Wednesdays (8:30-5pm) °$30 per visit, cash only  °[facility] ° [address], [place] (336) 641-4533 Patients are seen by appointment only. Walk-ins are not accepted. [facility] will see patients 18 years of age and older. °One Wednesday Evening (Monthly: Volunteer Based).   $30 per visit, cash only  °[facility]  (919) 537-3737 for adults; Children under age 4, call Graduate Pediatric Dentistry at (919) [phone]. Children aged 4-14, please call (919) [phone] to request a pediatric application. ° Dental services are provided in all areas of dental care including fillings, crowns and bridges, complete and partial dentures, implants, gum treatment, root canals, and extractions. Preventive care is also provided. Treatment is provided to both adults and children. °Patients are selected via a lottery and there is often a waiting list. °  °[facility] [address], °[place] ° (336) 763-8833 [email or web address] °  °[facility] [address], [place], [place] (336)723-1848, Ext. 123 Second and Fourth Thursday of each month, opens at 6:30 AM; Clinic ends at 9 AM.  Patients are seen on a first-come first-served basis, and a limited number are seen during each clinic.  ° °[facility] ° [address], [place], [place] (336) 723-7904   Eligibility Requirements °You must have lived in [place], [place], or [place] counties for at least the last three months. °  You cannot be eligible for state or federal sponsored healthcare insurance, including Veterans Administration, Medicaid, or Medicare. °  You generally cannot be eligible for healthcare insurance through your employer.  °  How to apply: °Eligibility screenings are held every Tuesday and Wednesday afternoon from 1:00 pm until 4:00 pm. You do not need an appointment for the interview!  °[facility] [address], [place], [place] 336-631-2330   °[facility]  336-342-8273   °[facility]  336-703-3100   °[facility]  336-570-6415   ° °Behavioral Health Resources in the Community: °Intensive Outpatient Programs °Organization         Address  Phone  Notes  °[facility] [address], [place], [place] 336-878-6098   °[facility] [address], [place], [place] 336-832-9800   °[facility] [address], [place], [place] ° 336-882-2125   °[facility] [address],  °[place], [place] 1-800-853-5163 or 336-641-4981   °Substance Abuse Resources °Organization         Address  Phone  Notes  °[facility]  336-882-2125   °[facility]  336-784-9470   °[facility]  336-285-9073   °[facility]  336-845-3988   °Residential & Outpatient Substance Abuse Program  1-800-659-3381   °Psychological Services °Organization         Address  Phone  Notes  °[facility]  336- 832-9600   °[facility]  336- 378-7881   °[facility] [address], [place] 1-800-853-5163 or 336-641-4981   ° °Mobile Crisis Teams °Organization         Address  Phone  Notes  °[facility], Mobile Crisis Care Unit  1-877-626-1772   °Assertive °[facility] ° [address] [place], [place] 336-834-9664   °Sharon DeEsch [address] °[place] 336-554-5454   ° °Self-Help/Support Groups °Organization         Address  Phone             Notes  °[facility] - variety of support groups  336- 373-1402 Call for more information  °Narcotics Anonymous (NA), [facility] [address], °[place]  2 meetings at this location  ° °Residential Treatment Programs °Organization         Address  Phone  Notes  °[facility] [address],    °[place]  1-866-801-8205   °[facility] ° [address], [place], [place] 704-293-8524   °[facility] [address], [place] 336-845-3988 Admissions: 8am-3pm M-F  °[facility] [address]    °[place], [place] 336-841-1104   °[facility] [address], [place], [place] 336-379-7146   °[facility] [address]  °[place], [place] 336-285-9073   °[facility] - Intensive Outpatient [address], [place], [place] 336-852-3033   °[facility] [address]  °[place], [place] 1-877-615-2722 or 336-784-9470   °[facility] [address] [place], [place] 336-227-7417 Accepts Medicaid  °[facility] [address]  °[place] 1-800-659-3381 Substance Abuse/Addiction Treatment  ° °[place] Behavioral Health Resources °Organization         Address  Phone  Notes  °[facility]  (888) 581-9988   °Julie Brannon, PhD [address] [place], [place]   (336) 349-5553 or (336) 951-0000   °[facility]   [address] °[place], [place] (336) 349-4454   °[facility] [address], [place], [place] (336) 342-8316 Insurance/Medicaid/sponsorship through [facility]  °[facility] [address]                                    [place], [place] (336) 342-8316 Therapy/tele-psych/case  °[facility] [address]  ° [place], [place] (336) 349-2233    °Dr. Arfeen  (336) 349-4544   °[facility] [place]  [employer] [place] [facility] 1) [address], [place] °2) [address], [place] °3)  [address], [place] (336) 349-3220 °(336) 342-7768 ° °(336) 342-8140   °[place] [facility] (336) 342-1394 or (336) 342-3537 (After Hours)    ° ° ° °

## 2016-01-29 NOTE — ED Notes (Signed)
Pt comes in needing refill for BP medication. Pt ran out of his BP medication several months ago. Pt denies any pain or head pressure. BP 192/75 in triage.

## 2018-03-06 ENCOUNTER — Ambulatory Visit: Payer: PRIVATE HEALTH INSURANCE | Admitting: Cardiovascular Disease

## 2018-04-08 ENCOUNTER — Ambulatory Visit: Payer: PRIVATE HEALTH INSURANCE | Admitting: Cardiovascular Disease

## 2018-04-08 ENCOUNTER — Encounter: Payer: Self-pay | Admitting: Cardiovascular Disease

## 2018-05-26 ENCOUNTER — Ambulatory Visit: Payer: PRIVATE HEALTH INSURANCE | Admitting: Cardiovascular Disease

## 2021-03-03 ENCOUNTER — Encounter (HOSPITAL_COMMUNITY): Payer: Self-pay | Admitting: Emergency Medicine

## 2021-03-03 ENCOUNTER — Encounter (HOSPITAL_COMMUNITY)
Admission: EM | Disposition: A | Discharge status: Home / Self Care | Payer: Self-pay | Source: Home / Self Care | Attending: Cardiology

## 2021-03-03 ENCOUNTER — Emergency Department (HOSPITAL_COMMUNITY): Payer: PRIVATE HEALTH INSURANCE

## 2021-03-03 ENCOUNTER — Other Ambulatory Visit: Payer: Self-pay

## 2021-03-03 ENCOUNTER — Inpatient Hospital Stay (HOSPITAL_COMMUNITY)
Admission: EM | Admit: 2021-03-03 | Discharge: 2021-03-07 | DRG: 246 | Disposition: A | Discharge status: Home / Self Care | Payer: PRIVATE HEALTH INSURANCE | Attending: Cardiology | Admitting: Cardiology

## 2021-03-03 ENCOUNTER — Observation Stay (HOSPITAL_BASED_OUTPATIENT_CLINIC_OR_DEPARTMENT_OTHER): Payer: PRIVATE HEALTH INSURANCE

## 2021-03-03 DIAGNOSIS — I2510 Atherosclerotic heart disease of native coronary artery without angina pectoris: Secondary | ICD-10-CM | POA: Diagnosis not present

## 2021-03-03 DIAGNOSIS — Z720 Tobacco use: Secondary | ICD-10-CM

## 2021-03-03 DIAGNOSIS — E785 Hyperlipidemia, unspecified: Secondary | ICD-10-CM

## 2021-03-03 DIAGNOSIS — I10 Essential (primary) hypertension: Secondary | ICD-10-CM | POA: Diagnosis not present

## 2021-03-03 DIAGNOSIS — I110 Hypertensive heart disease with heart failure: Secondary | ICD-10-CM | POA: Diagnosis present

## 2021-03-03 DIAGNOSIS — I255 Ischemic cardiomyopathy: Secondary | ICD-10-CM | POA: Diagnosis present

## 2021-03-03 DIAGNOSIS — Z951 Presence of aortocoronary bypass graft: Secondary | ICD-10-CM

## 2021-03-03 DIAGNOSIS — Z955 Presence of coronary angioplasty implant and graft: Secondary | ICD-10-CM

## 2021-03-03 DIAGNOSIS — E782 Mixed hyperlipidemia: Secondary | ICD-10-CM | POA: Diagnosis present

## 2021-03-03 DIAGNOSIS — Z20822 Contact with and (suspected) exposure to covid-19: Secondary | ICD-10-CM | POA: Diagnosis present

## 2021-03-03 DIAGNOSIS — I25810 Atherosclerosis of coronary artery bypass graft(s) without angina pectoris: Secondary | ICD-10-CM

## 2021-03-03 DIAGNOSIS — I214 Non-ST elevation (NSTEMI) myocardial infarction: Secondary | ICD-10-CM | POA: Diagnosis not present

## 2021-03-03 DIAGNOSIS — J449 Chronic obstructive pulmonary disease, unspecified: Secondary | ICD-10-CM | POA: Diagnosis not present

## 2021-03-03 DIAGNOSIS — F17210 Nicotine dependence, cigarettes, uncomplicated: Secondary | ICD-10-CM | POA: Diagnosis present

## 2021-03-03 DIAGNOSIS — Z7902 Long term (current) use of antithrombotics/antiplatelets: Secondary | ICD-10-CM

## 2021-03-03 DIAGNOSIS — Z79899 Other long term (current) drug therapy: Secondary | ICD-10-CM

## 2021-03-03 DIAGNOSIS — K219 Gastro-esophageal reflux disease without esophagitis: Secondary | ICD-10-CM | POA: Diagnosis present

## 2021-03-03 DIAGNOSIS — I5021 Acute systolic (congestive) heart failure: Secondary | ICD-10-CM

## 2021-03-03 DIAGNOSIS — I471 Supraventricular tachycardia: Secondary | ICD-10-CM | POA: Diagnosis not present

## 2021-03-03 DIAGNOSIS — I2582 Chronic total occlusion of coronary artery: Secondary | ICD-10-CM | POA: Diagnosis present

## 2021-03-03 DIAGNOSIS — Z7982 Long term (current) use of aspirin: Secondary | ICD-10-CM

## 2021-03-03 DIAGNOSIS — R0789 Other chest pain: Secondary | ICD-10-CM

## 2021-03-03 HISTORY — PX: LEFT HEART CATH AND CORS/GRAFTS ANGIOGRAPHY: CATH118250

## 2021-03-03 HISTORY — PX: CORONARY STENT INTERVENTION: CATH118234

## 2021-03-03 HISTORY — DX: Chronic obstructive pulmonary disease, unspecified: J44.9

## 2021-03-03 LAB — ECHOCARDIOGRAM COMPLETE
Area-P 1/2: 2.71 cm2
Height: 71 in
MV M vel: 5.3 m/s
MV Peak grad: 112.4 mmHg
S' Lateral: 4.35 cm
Weight: 3008 oz

## 2021-03-03 LAB — CBC WITH DIFFERENTIAL/PLATELET
Abs Immature Granulocytes: 0.03 10*3/uL (ref 0.00–0.07)
Basophils Absolute: 0 10*3/uL (ref 0.0–0.1)
Basophils Relative: 0 %
Eosinophils Absolute: 0.1 10*3/uL (ref 0.0–0.5)
Eosinophils Relative: 1 %
HCT: 42.7 % (ref 39.0–52.0)
Hemoglobin: 14.1 g/dL (ref 13.0–17.0)
Immature Granulocytes: 0 %
Lymphocytes Relative: 19 %
Lymphs Abs: 1.8 10*3/uL (ref 0.7–4.0)
MCH: 30.3 pg (ref 26.0–34.0)
MCHC: 33 g/dL (ref 30.0–36.0)
MCV: 91.8 fL (ref 80.0–100.0)
Monocytes Absolute: 1.3 10*3/uL — ABNORMAL HIGH (ref 0.1–1.0)
Monocytes Relative: 14 %
Neutro Abs: 6.5 10*3/uL (ref 1.7–7.7)
Neutrophils Relative %: 66 %
Platelets: 212 10*3/uL (ref 150–400)
RBC: 4.65 MIL/uL (ref 4.22–5.81)
RDW: 12.4 % (ref 11.5–15.5)
WBC: 9.8 10*3/uL (ref 4.0–10.5)
nRBC: 0 % (ref 0.0–0.2)

## 2021-03-03 LAB — COMPREHENSIVE METABOLIC PANEL
ALT: 29 U/L (ref 0–44)
AST: 35 U/L (ref 15–41)
Albumin: 3.6 g/dL (ref 3.5–5.0)
Alkaline Phosphatase: 98 U/L (ref 38–126)
Anion gap: 10 (ref 5–15)
BUN: 24 mg/dL — ABNORMAL HIGH (ref 8–23)
CO2: 27 mmol/L (ref 22–32)
Calcium: 8.9 mg/dL (ref 8.9–10.3)
Chloride: 101 mmol/L (ref 98–111)
Creatinine, Ser: 1 mg/dL (ref 0.61–1.24)
GFR, Estimated: >60 mL/min (ref >60)
Glucose, Bld: 116 mg/dL — ABNORMAL HIGH (ref 70–99)
Potassium: 4.5 mmol/L (ref 3.5–5.1)
Sodium: 138 mmol/L (ref 135–145)
Total Bilirubin: 0.6 mg/dL (ref 0.3–1.2)
Total Protein: 6.9 g/dL (ref 6.5–8.1)

## 2021-03-03 LAB — TROPONIN I (HIGH SENSITIVITY)
Troponin I (High Sensitivity): 6425 ng/L (ref <18)
Troponin I (High Sensitivity): 6404 ng/L (ref <18)

## 2021-03-03 LAB — HEPARIN LEVEL (UNFRACTIONATED): Heparin Unfractionated: 1.3 IU/mL — ABNORMAL HIGH (ref 0.30–0.70)

## 2021-03-03 LAB — RESP PANEL BY RT-PCR (FLU A&B, COVID) ARPGX2
Influenza A by PCR: NEGATIVE
Influenza B by PCR: NEGATIVE
SARS Coronavirus 2 by RT PCR: NEGATIVE

## 2021-03-03 LAB — HIV ANTIBODY (ROUTINE TESTING W REFLEX): HIV Screen 4th Generation wRfx: NONREACTIVE

## 2021-03-03 LAB — HEMOGLOBIN A1C
Hgb A1c MFr Bld: 5.7 % — ABNORMAL HIGH (ref 4.8–5.6)
Mean Plasma Glucose: 116.89 mg/dL

## 2021-03-03 LAB — POCT ACTIVATED CLOTTING TIME: Activated Clotting Time: 327 seconds

## 2021-03-03 SURGERY — LEFT HEART CATH AND CORS/GRAFTS ANGIOGRAPHY
Anesthesia: LOCAL

## 2021-03-03 MED ORDER — HEPARIN SODIUM (PORCINE) 1000 UNIT/ML IJ SOLN
INTRAMUSCULAR | Status: AC
  Filled 2021-03-03: qty 1

## 2021-03-03 MED ORDER — METOPROLOL TARTRATE 25 MG PO TABS
25.0000 mg | ORAL_TABLET | Freq: Two times a day (BID) | ORAL | Status: DC
  Administered 2021-03-03 – 2021-03-04 (×3): 25 mg via ORAL
  Filled 2021-03-03 (×3): qty 1

## 2021-03-03 MED ORDER — HEPARIN BOLUS VIA INFUSION
4000.0000 [IU] | Freq: Once | INTRAVENOUS | Status: AC
  Administered 2021-03-03: 4000 [IU] via INTRAVENOUS

## 2021-03-03 MED ORDER — LIDOCAINE HCL (PF) 1 % IJ SOLN
INTRAMUSCULAR | Status: AC
  Filled 2021-03-03: qty 30

## 2021-03-03 MED ORDER — SODIUM CHLORIDE 0.9% FLUSH: 3.0000 mL | INTRAVENOUS | Status: DC | PRN

## 2021-03-03 MED ORDER — ASPIRIN EC 81 MG PO TBEC
81.0000 mg | DELAYED_RELEASE_TABLET | Freq: Every day | ORAL | Status: DC
  Administered 2021-03-04 – 2021-03-07 (×3): 81 mg via ORAL
  Filled 2021-03-03 (×3): qty 1

## 2021-03-03 MED ORDER — SODIUM CHLORIDE 0.9 % IV SOLN: INTRAVENOUS | Status: AC

## 2021-03-03 MED ORDER — LABETALOL HCL 5 MG/ML IV SOLN: 10.0000 mg | INTRAVENOUS | Status: AC | PRN

## 2021-03-03 MED ORDER — MIDAZOLAM HCL 2 MG/2ML IJ SOLN
INTRAMUSCULAR | Status: AC
  Filled 2021-03-03: qty 2

## 2021-03-03 MED ORDER — HEPARIN SODIUM (PORCINE) 1000 UNIT/ML IJ SOLN
INTRAMUSCULAR | Status: DC | PRN
  Administered 2021-03-03: 6500 [IU] via INTRAVENOUS
  Administered 2021-03-03: 4500 [IU] via INTRAVENOUS

## 2021-03-03 MED ORDER — SODIUM CHLORIDE 0.9 % IV SOLN: INTRAVENOUS | Status: DC

## 2021-03-03 MED ORDER — NITROGLYCERIN 2 % TD OINT
1.0000 [in_us] | TOPICAL_OINTMENT | Freq: Four times a day (QID) | TRANSDERMAL | Status: DC
  Administered 2021-03-03 – 2021-03-07 (×13): 1 [in_us] via TOPICAL
  Filled 2021-03-03: qty 30
  Filled 2021-03-03: qty 1

## 2021-03-03 MED ORDER — MIDAZOLAM HCL 2 MG/2ML IJ SOLN
INTRAMUSCULAR | Status: DC | PRN
  Administered 2021-03-03: 2 mg via INTRAVENOUS

## 2021-03-03 MED ORDER — TICAGRELOR 90 MG PO TABS
ORAL_TABLET | ORAL | Status: AC
  Filled 2021-03-03: qty 2

## 2021-03-03 MED ORDER — VERAPAMIL HCL 2.5 MG/ML IV SOLN
INTRAVENOUS | Status: DC | PRN
  Administered 2021-03-03: 10 mL via INTRA_ARTERIAL

## 2021-03-03 MED ORDER — TICAGRELOR 90 MG PO TABS
ORAL_TABLET | ORAL | Status: DC | PRN
  Administered 2021-03-03: 180 mg via ORAL

## 2021-03-03 MED ORDER — SODIUM CHLORIDE 0.9% FLUSH
3.0000 mL | Freq: Two times a day (BID) | INTRAVENOUS | Status: DC
  Administered 2021-03-03 – 2021-03-05 (×4): 3 mL via INTRAVENOUS

## 2021-03-03 MED ORDER — HEPARIN (PORCINE) IN NACL 1000-0.9 UT/500ML-% IV SOLN
INTRAVENOUS | Status: AC
  Filled 2021-03-03: qty 1000

## 2021-03-03 MED ORDER — VERAPAMIL HCL 2.5 MG/ML IV SOLN
INTRAVENOUS | Status: AC
  Filled 2021-03-03: qty 2

## 2021-03-03 MED ORDER — HEPARIN (PORCINE) 25000 UT/250ML-% IV SOLN
1000.0000 [IU]/h | INTRAVENOUS | Status: DC
  Administered 2021-03-03: 1000 [IU]/h via INTRAVENOUS
  Filled 2021-03-03: qty 250

## 2021-03-03 MED ORDER — ACETAMINOPHEN 325 MG PO TABS: 650.0000 mg | ORAL_TABLET | ORAL | Status: DC | PRN

## 2021-03-03 MED ORDER — ASPIRIN 81 MG PO CHEW
324.0000 mg | CHEWABLE_TABLET | Freq: Once | ORAL | Status: AC
  Administered 2021-03-03: 324 mg via ORAL
  Filled 2021-03-03: qty 4

## 2021-03-03 MED ORDER — NITROGLYCERIN 1 MG/10 ML FOR IR/CATH LAB
INTRA_ARTERIAL | Status: AC
  Filled 2021-03-03: qty 10

## 2021-03-03 MED ORDER — HYDRALAZINE HCL 20 MG/ML IJ SOLN: 10.0000 mg | INTRAMUSCULAR | Status: AC | PRN

## 2021-03-03 MED ORDER — HEPARIN (PORCINE) IN NACL 1000-0.9 UT/500ML-% IV SOLN
INTRAVENOUS | Status: DC | PRN
  Administered 2021-03-03: 500 mL

## 2021-03-03 MED ORDER — IOHEXOL 350 MG/ML SOLN
INTRAVENOUS | Status: DC | PRN
  Administered 2021-03-03: 145 mL

## 2021-03-03 MED ORDER — NITROGLYCERIN 1 MG/10 ML FOR IR/CATH LAB
INTRA_ARTERIAL | Status: DC | PRN
  Administered 2021-03-03: 200 ug via INTRACORONARY

## 2021-03-03 MED ORDER — FENTANYL CITRATE (PF) 100 MCG/2ML IJ SOLN
INTRAMUSCULAR | Status: DC | PRN
  Administered 2021-03-03: 50 ug via INTRAVENOUS

## 2021-03-03 MED ORDER — FENTANYL CITRATE (PF) 100 MCG/2ML IJ SOLN
INTRAMUSCULAR | Status: AC
  Filled 2021-03-03: qty 2

## 2021-03-03 MED ORDER — ALBUTEROL SULFATE HFA 108 (90 BASE) MCG/ACT IN AERS
4.0000 | INHALATION_SPRAY | Freq: Once | RESPIRATORY_TRACT | Status: AC
  Administered 2021-03-03: 4 via RESPIRATORY_TRACT
  Filled 2021-03-03: qty 6.7

## 2021-03-03 MED ORDER — ONDANSETRON HCL 4 MG/2ML IJ SOLN: 4.0000 mg | Freq: Four times a day (QID) | INTRAMUSCULAR | Status: DC | PRN

## 2021-03-03 MED ORDER — ATORVASTATIN CALCIUM 80 MG PO TABS
80.0000 mg | ORAL_TABLET | Freq: Every day | ORAL | Status: DC
  Administered 2021-03-03 – 2021-03-07 (×5): 80 mg via ORAL
  Filled 2021-03-03 (×5): qty 1

## 2021-03-03 MED ORDER — SODIUM CHLORIDE 0.9 % IV SOLN: 250.0000 mL | INTRAVENOUS | Status: DC | PRN

## 2021-03-03 MED ORDER — NITROGLYCERIN 0.4 MG SL SUBL: 0.4000 mg | SUBLINGUAL_TABLET | SUBLINGUAL | Status: DC | PRN

## 2021-03-03 MED ORDER — TICAGRELOR 90 MG PO TABS
90.0000 mg | ORAL_TABLET | Freq: Two times a day (BID) | ORAL | Status: DC
  Administered 2021-03-04 – 2021-03-07 (×8): 90 mg via ORAL
  Filled 2021-03-03 (×9): qty 1

## 2021-03-03 MED ORDER — HEPARIN (PORCINE) IN NACL 1000-0.9 UT/500ML-% IV SOLN
INTRAVENOUS | Status: DC | PRN
Start: 2021-03-03 — End: 2021-03-03
  Administered 2021-03-03: 500 mL

## 2021-03-03 MED ORDER — LIDOCAINE HCL (PF) 1 % IJ SOLN
INTRAMUSCULAR | Status: DC | PRN
  Administered 2021-03-03: 3 mL

## 2021-03-03 MED ORDER — ADENOSINE (DIAGNOSTIC) FOR INTRACORONARY USE
INTRAVENOUS | Status: DC | PRN
  Administered 2021-03-03 (×3): 24 ug via INTRACORONARY

## 2021-03-03 MED ORDER — IOHEXOL 350 MG/ML SOLN
INTRAVENOUS | Status: AC
  Filled 2021-03-03: qty 1

## 2021-03-03 SURGICAL SUPPLY — 17 items
BALLN SAPPHIRE 2.5X12 (BALLOONS) ×2
BALLOON SAPPHIRE 2.5X12 (BALLOONS) ×1 IMPLANT
CATH INFINITI 5FR AL1 (CATHETERS) ×2 IMPLANT
CATH INFINITI 5FR MULTPACK ANG (CATHETERS) ×2 IMPLANT
DEVICE RAD COMP TR BAND LRG (VASCULAR PRODUCTS) ×2 IMPLANT
DEVICE SPIDERFX EMB PROT 4MM (WIRE) ×2 IMPLANT
GLIDESHEATH SLEND SS 6F .021 (SHEATH) ×2 IMPLANT
GUIDEWIRE INQWIRE 1.5J.035X260 (WIRE) ×1 IMPLANT
INQWIRE 1.5J .035X260CM (WIRE) ×2
KIT ENCORE 26 ADVANTAGE (KITS) ×2 IMPLANT
KIT HEART LEFT (KITS) ×2 IMPLANT
PACK CARDIAC CATHETERIZATION (CUSTOM PROCEDURE TRAY) ×2 IMPLANT
STENT SYNERGY XD 4.0X20 (Permanent Stent) ×1 IMPLANT
SYNERGY XD 4.0X20 (Permanent Stent) ×2 IMPLANT
TRANSDUCER W/STOPCOCK (MISCELLANEOUS) ×2 IMPLANT
TUBING CIL FLEX 10 FLL-RA (TUBING) ×2 IMPLANT
WIRE COUGAR XT STRL 190CM (WIRE) ×2 IMPLANT

## 2021-03-03 NOTE — ED Notes (Signed)
Date and time results received: 03/03/21 1:17 PM  Test: Troponin Critical Value: 6,404  Name of Provider Notified: Zackowski   Orders Received? Or Actions Taken?: N/A @ this time

## 2021-03-03 NOTE — Progress Notes (Signed)
On arrival to holding area transported here by [facility]link, Heparin infusing at 1000U/hr to left ac.

## 2021-03-03 NOTE — Interval H&P Note (Signed)
History and Physical Interval Note:  03/03/2021 4:19 PM  Mark Carpenter  has presented today for surgery, with the diagnosis of nstemi.  The various methods of treatment have been discussed with the patient and family. After consideration of risks, benefits and other options for treatment, the patient has consented to  Procedure(s): LEFT HEART CATH AND CORS/GRAFTS ANGIOGRAPHY (N/A) as a surgical intervention.  The patient's history has been reviewed, patient examined, no change in status, stable for surgery.  I have reviewed the patient's chart and labs.  Questions were answered to the patient's satisfaction.    Cath Lab Visit (complete for each Cath Lab visit)  Clinical Evaluation Leading to the Procedure:   ACS: Yes.    Non-ACS:    Anginal Classification: CCS III  Anti-ischemic medical therapy: Minimal Therapy (1 class of medications)  Non-Invasive Test Results: No non-invasive testing performed  Prior CABG: Previous CABG        Verne Carrow

## 2021-03-03 NOTE — H&P (Signed)
Cardiology Admission History and Physical:   Patient ID: Mark Carpenter; [id number]; 11/15/1956   Admission date: 03/03/2021  Primary Care Provider: Patient, No Pcp Per Primary Cardiologist: Previously Dr. Tonny Bollman (not seen since 2014) Primary Electrophysiologist: None  Chief Complaint: Recurrent chest pain and shortness of breath  Patient Profile:   Mark Carpenter is a 65 y.o. male with a history of left main/multivessel CAD status post CABG in 2014, tobacco abuse, hypertension, hyperlipidemia, and COPD.  History of Present Illness:   Mark Carpenter presents to the [facility] ER complaining of recurrent chest pressure over the last 3 days, fairly intense, associated with shortness of breath and also radiation to the shoulder blades.  He is status post CABG in 2014 as detailed below.  Postoperative course was complicated by subcutaneous emphysema and recurrent pneumothoraces in the setting of COPD, did require chest tube.  He has had no cardiology follow-up since 2014.  States that his wife passed away a year and a half ago, he became depressed thereafter and stopped all of his medications.  He currently works for a [employer].  Has a longstanding history of tobacco abuse, states that he stopped smoking 3 days ago when his symptoms started.  He reports no active chest pain on my examination in the ER.  ECG shows prominent anterolateral ST segment depression.  Initial high-sensitivity troponin I is 6425.  Past Medical History:  Diagnosis Date  . COPD (chronic obstructive pulmonary disease) (HCC)   . Coronary artery disease    Left main/multivessel status post CABG 2014 (LIMA to LAD, SVG to ramus, SVG to OM1 and distal circumflex, SVG to PDA)  . GERD (gastroesophageal reflux disease)   . Hyperlipemia   . Hypertension     Past Surgical History:  Procedure Laterality Date  . APPENDECTOMY    . CORONARY ARTERY BYPASS GRAFT N/A 05/04/2013   Procedure: CORONARY ARTERY BYPASS GRAFTING  times five using Right Greater Saphenous Vein Graft harvested endoscopically and Left Internal Mammary Artery;  Surgeon: Kerin Perna, MD;  Location: [facility] OR;  Service: Open Heart Surgery;  Laterality: N/A;  . INTRAOPERATIVE TRANSESOPHAGEAL ECHOCARDIOGRAM N/A 05/04/2013   Procedure: INTRAOPERATIVE TRANSESOPHAGEAL ECHOCARDIOGRAM;  Surgeon: Kerin Perna, MD;  Location: [facility] OR;  Service: Open Heart Surgery;  Laterality: N/A;  . INTRAVASCULAR ULTRASOUND  04/30/2013   Procedure: INTRAVASCULAR ULTRASOUND;  Surgeon: Tonny Bollman, MD;  Location: [facility] CATH LAB;  Service: Cardiovascular;;  . LEFT HEART CATHETERIZATION WITH CORONARY ANGIOGRAM N/A 04/30/2013   Procedure: LEFT HEART CATHETERIZATION WITH CORONARY ANGIOGRAM;  Surgeon: Tonny Bollman, MD;  Location: [facility] CATH LAB;  Service: Cardiovascular;  Laterality: N/A;     Medications Prior to Admission: Prior to Admission medications   Medication Sig Start Date End Date Taking? Authorizing Provider  aspirin EC 325 MG tablet Take 325 mg by mouth every morning.    [provider]  atorvastatin (LIPITOR) 40 MG tablet Take 1 tablet (40 mg total) by mouth daily at 6 PM. Patient not taking: Reported on 03/03/2021 05/10/13   Barrett, Rae Roam, PA-C  budesonide-formoterol (SYMBICORT) 160-4.5 MCG/ACT inhaler Inhale 2 puffs into the lungs 2 (two) times daily. Patient not taking: Reported on 03/03/2021 05/10/13   Barrett, Rae Roam, PA-C  clopidogrel (PLAVIX) 75 MG tablet Take 1 tablet (75 mg total) by mouth daily. 01/29/16   Elson Areas, PA-C  dexamethasone (DECADRON) 4 MG tablet Take 1 tablet (4 mg total) by mouth 2 (two) times daily with a meal. Patient not  taking: Reported on 03/03/2021 12/27/14   Ivery Quale, PA-C  diclofenac (VOLTAREN) 75 MG EC tablet Take 1 tablet (75 mg total) by mouth 2 (two) times daily. Patient not taking: Reported on 03/03/2021 12/27/14   Ivery Quale, PA-C  diphenhydramine-acetaminophen (TYLENOL PM) 25-500 MG TABS Take 2 tablets by mouth  at bedtime.    [provider]  HYDROcodone-acetaminophen (NORCO) 10-325 MG per tablet Take 1 tablet by mouth every 4 (four) hours as needed for pain. Patient not taking: No sig reported 05/28/13   Donata Clay, Theron Arista, MD  ibuprofen (ADVIL,MOTRIN) 200 MG tablet Take 1,000 mg by mouth every 8 (eight) hours as needed (pain).    [provider]  lisinopril (PRINIVIL,ZESTRIL) 10 MG tablet Take 1 tablet (10 mg total) by mouth daily. Patient not taking: Reported on 03/03/2021 01/29/16   Elson Areas, PA-C  metoprolol tartrate (LOPRESSOR) 25 MG tablet Take 0.5 tablets (12.5 mg total) by mouth 2 (two) times daily. Patient not taking: Reported on 03/03/2021 01/29/16   Elson Areas, PA-C     Allergies:   No Known Allergies  Social History:   Social History   Tobacco Use  . Smoking status: Current Every Day Smoker    Packs/day: 1.50    Years: 35.00    Pack years: 52.50    Types: Cigarettes  . Smokeless tobacco: Never Used  Substance Use Topics  . Alcohol use: No    Family History:  The patient's family history is not on file. He was adopted.    ROS:  Please see the history of present illness.  Intermittent "phlegm" in the chest.  No fevers or chills.  All other ROS reviewed and negative.     Physical Exam/Data:   Vitals:   03/03/21 0947 03/03/21 0953 03/03/21 1030 03/03/21 1100  BP:  (!) 196/90 (!) 154/71 (!) 153/72  Pulse:  95 73 72  Resp:  (!) 29 (!) 22 (!) 25  Temp:  98.9 F (37.2 C)    TempSrc:  Oral    SpO2:  99% 98% 98%  Weight: 85.3 kg     Height: 5\' 11"  (1.803 m)      No intake or output data in the 24 hours ending 03/03/21 1133 Filed Weights   03/03/21 0947  Weight: 85.3 kg   Body mass index is 26.22 kg/m.   Gen: Patient is in no distress. HEENT: Conjunctiva and lids normal, wearing a mask. Neck: Supple, no elevated JVP or carotid bruits, no thyromegaly. Lungs: Decreased breath sounds without wheezing, nonlabored breathing at rest. Cardiac:  Regular rate and rhythm, no S3, soft systolic murmur, no pericardial rub. Abdomen: Soft, nontender, bowel sounds present,. Extremities: No pitting edema, distal pulses 2+. Skin: Warm and dry. Musculoskeletal: No kyphosis. Neuropsychiatric: Alert and oriented x3, affect grossly appropriate.  EKG:  An ECG dated 03/03/2021 was personally reviewed today and demonstrated:  Sinus rhythm with anterolateral ST segment depression.  Relevant CV Studies:  Cardiac catheterization 04/30/2013: Coronary angiography: Coronary dominance: right  Left mainstem: At least 60% distal left main stenosis.   Left anterior descending (LAD): 20-30% proximal stenosis.  Otherwise luminal irregularities.   Left circumflex (LCx): 60-70% ostial LCx stenosis.  50% mid LCx stenosis.  There is a high obtuse marginal that branches and then is totally occluded.  There are faint left to left collaterals filling the distal vessel.   Right coronary artery (RCA): 50% mid-vessel stenosis, 70% distal vessel stenosis.  70% mid PDA stenosis.  There is a  moderate acute marginal with 90% proximal stenosis.   Left ventriculography: Left ventricular systolic function is normal, LVEF is estimated at 55-60%.  There appears to be some hypokinesis in the mid anterolateral wall.   Final Conclusions:  Culprit lesion is likely the totally occluded proximal high OM.  By symptoms, this probably happened about a week ago.  Cardiac enzymes were negative.  There is a significant distal LM stenosis extending into the ostial LCx.  The distal LM appears at least 60%, may be worse.  The ostial LCx I would estimate at 60-70%.    Recommendations: IVUS the distal left main today to decide on significance of this lesion.  If it appears significant, CABG is likely the best choice.  If not, medical management (chronic total occlusion of high OM).   Procedural Findings: Intravascular ultrasound:  The proximal LAD has nonobstructive circumferential  plaque. The distal left mainstem has focal soft plaque that is ovoid and circumferential.  The mid and proximal left main R. angiographically normal. The mid left main as a reference area of 19 mm. The distal left main in the area of circumferential but eccentric plaque hasn't area of 6.3 mm. This is a 67% stenosis.  Final Conclusions:  Moderately severe distal left main stenosis with associated eccentric soft plaque.  Recommendations: Considering the patient's ostial left circumflex stenosis, diffuse RCA stenosis, and moderate left main stenosis, I think multivessel coronary bypass surgery is indicated. Will consult cardiac surgery.  Echocardiogram 04/30/2013: Left ventricle: The cavity size was normal. Wall thickness  was normal. Systolic function was moderately reduced. The  estimated ejection fraction was in the range of 35% to 40%.   Laboratory Data:  Chemistry Recent Labs  Lab 03/03/21 1003  NA 138  K 4.5  CL 101  CO2 27  GLUCOSE 116*  BUN 24*  CREATININE 1.00  CALCIUM 8.9  GFRNONAA >60  ANIONGAP 10    Recent Labs  Lab 03/03/21 1003  PROT 6.9  ALBUMIN 3.6  AST 35  ALT 29  ALKPHOS 98  BILITOT 0.6   Hematology Recent Labs  Lab 03/03/21 1003  WBC 9.8  RBC 4.65  HGB 14.1  HCT 42.7  MCV 91.8  MCH 30.3  MCHC 33.0  RDW 12.4  PLT 212   Cardiac Enzymes Recent Labs  Lab 03/03/21 1003  TROPONINIHS 6,425*    Radiology/Studies:  DG Chest Port 1 View  Result Date: 03/03/2021 CLINICAL DATA:  Chest pain for 4 days, cough. EXAM: PORTABLE CHEST 1 VIEW COMPARISON:  06/02/2013. FINDINGS: Trachea is midline. Heart size is accentuated by AP technique. Thoracic aorta is calcified. Lungs are clear. No pleural fluid. IMPRESSION: No acute findings. Electronically Signed   By: Leanna Battles M.D.   On: 03/03/2021 10:29    Assessment and Plan:   1.  NSTEMI, initial high-sensitivity troponin I 6425.  Symptom onset 3 days ago with recurring chest discomfort.  He has  not been on any regular medications for a year and a half.  ECG shows significant anterolateral ST segment depression.  He has a history of multivessel CAD status post CABG in 2014.  Also apparently ischemic cardiomyopathy with LVEF 35 to 40% at that time, LVEF not recently evaluated.  2.  Longstanding tobacco abuse with COPD.  3.  Essential hypertension.  In the past was taking Lopressor and lisinopril.  4.  Mixed hyperlipidemia, previously on Lipitor.  LDL was 113 in 2014.  Discussed transfer to [facility] for diagnostic cardiac catheterization with  eye toward revascularization.  Risks and benefits reviewed, patient is in agreement to proceed.  He has been given aspirin in the ER, starting heparin and nitroglycerin paste as well as Lopressor.  Start high-dose statin as well.  He will need a follow-up echocardiogram to reassess LVEF.  N.p.o. for now.  Signed, Nona DellSamuel Corisa Montini, MD  03/03/2021 11:33 AM

## 2021-03-03 NOTE — ED Notes (Signed)
Date and time results received: 03/03/21 10:43 AM    Test: Troponin Critical Value: 6,421  Name of Provider Notified: Zackowski   Orders Received? Or Actions Taken?: pending

## 2021-03-03 NOTE — Progress Notes (Signed)
ANTICOAGULATION CONSULT NOTE - Initial Consult  Pharmacy Consult for heparin gtt  Indication: chest pain/ACS  No Known Allergies  Patient Measurements: Height: 5\' 11"  (180.3 cm) Weight: 85.3 kg (188 lb) IBW/kg (Calculated) : 75.3 Heparin Dosing Weight: HEPARIN DW (KG): 85.3   Vital Signs: Temp: 98.9 F (37.2 C) (03/11 0953) Temp Source: Oral (03/11 0953) BP: 153/72 (03/11 1100) Pulse Rate: 72 (03/11 1100)  Labs: Recent Labs    03/03/21 1003  HGB 14.1  HCT 42.7  PLT 212  CREATININE 1.00    Estimated Creatinine Clearance: 78.4 mL/min (by C-G formula based on SCr of 1 mg/dL).   Medical History: Past Medical History:  Diagnosis Date  . COPD (chronic obstructive pulmonary disease) (HCC)   . Coronary artery disease    Left main/multivessel status post CABG 2014 (LIMA to LAD, SVG to ramus, SVG to OM1 and distal circumflex, SVG to PDA)  . GERD (gastroesophageal reflux disease)   . Hyperlipemia   . Hypertension     Medications:  (Not in a hospital admission)  Scheduled:  . heparin  4,000 Units Intravenous Once  . metoprolol tartrate  25 mg Oral BID  . nitroGLYCERIN  1 inch Topical Q6H   Infusions:  . heparin     PRN:  Anti-infectives (From admission, onward)   None      Assessment: RAAHIM SHARTZER a 65 y.o. male requires anticoagulation with a heparin iv infusion for the indication of  chest pain/ACS. Heparin gtt will be started following pharmacy protocol per pharmacy consult. Patient is not on previous oral anticoagulant that will require aPTT/HL correlation before transitioning to only HL monitoring.   Goal of Therapy:  Heparin level 0.3-0.7 units/ml Monitor platelets by anticoagulation protocol: Yes   Plan:  Give 4000 units bolus x 1 Start heparin infusion at 1000 units/hr Check anti-Xa level in 6 hours and daily while on heparin Continue to monitor H&H and platelets   76 Vernadine Coombs 03/03/2021,11:35 AM

## 2021-03-03 NOTE — Progress Notes (Signed)
*  PRELIMINARY RESULTS* Echocardiogram 2D Echocardiogram has been performed.  Jeryl Columbia 03/03/2021, 1:42 PM

## 2021-03-03 NOTE — ED Provider Notes (Addendum)
Patient seen by me along with the physician assistant  I provided a substantive portion of the care of this patient.  I personally performed the entirety of the history, exam and medical decision making for this encounter.  EKG Interpretation  Date/Time:  Friday March 03 2021 09:50:41 EST Ventricular Rate:  85 PR Interval:    QRS Duration: 91 QT Interval:  370 QTC Calculation: 440 R Axis:   55 Text Interpretation: Sinus rhythm ST depression, consider ischemia, lateral lds Interpretation limited secondary to artifact Confirmed by Vanetta Mulders [id number]) on 03/03/2021 11:13:19 AM  Patient still with chest pain.  But stating that it is hurting kind of on and off.  Had chest pain starting 4 days ago.  EKG here today without any ST segment elevation consistent with STEMI.  But there is ST segment depression out laterally.  Patient's initial troponin markedly elevated 6000 range.  We have contacted cardiology.  Patient most likely non-STEMI.  Patient being seen by Dr. Diona Browner.  Heparin will be started for the non-STEMI.  Admission as per cardiology.  Patient hemodynamically stable initially blood pressures were 196/90.  But on its own it is improved to 154/71.     Vanetta Mulders, MD 03/03/21 1216  CRITICAL CARE Performed by: Vanetta Mulders Total critical care time: 45 minutes Critical care time was exclusive of separately billable procedures and treating other patients. Critical care was necessary to treat or prevent imminent or life-threatening deterioration. Critical care was time spent personally by me on the following activities: development of treatment plan with patient and/or surrogate as well as nursing, discussions with consultants, evaluation of patient's response to treatment, examination of patient, obtaining history from patient or surrogate, ordering and performing treatments and interventions, ordering and review of laboratory studies, ordering and review of radiographic  studies, pulse oximetry and re-evaluation of patient's condition.    Vanetta Mulders, MD 03/03/21 1216

## 2021-03-03 NOTE — ED Provider Notes (Addendum)
[facility] EMERGENCY DEPARTMENT Provider Note   CSN: [id number] Arrival date & time: 03/03/21  2751     History Chief Complaint  Patient presents with  . Chest Pain    Mark Carpenter is a 65 y.o. male.  The history is provided by the patient. No language interpreter was used.  Chest Pain Pain location:  Substernal area Pain quality: aching   Pain radiates to:  Does not radiate Pain severity:  No pain Onset quality:  Unable to specify Duration:  4 days Timing:  Constant Progression:  Unchanged Chronicity:  New Relieved by:  Nothing Worsened by:  Nothing Ineffective treatments:  None tried Associated symptoms: shortness of breath   Risk factors: smoking    Pt reports he has not been taking any medications since his wife died 1.5 years ago. Pt states he just wanted to give up.     Past Medical History:  Diagnosis Date  . CHF (congestive heart failure) (HCC)   . Coronary artery disease   . GERD (gastroesophageal reflux disease)   . Hyperlipemia   . Hypertension   . Tobacco abuse    smoker 2 pk/day    Patient Active Problem List   Diagnosis Date Noted  . S/P CABG x 5 05/10/2013  . Volume excess 05/05/2013  . GERD (gastroesophageal reflux disease) 04/29/2013  . High triglycerides 04/29/2013  . Carotid artery bruit 04/29/2013  . HTN (hypertension) 04/29/2013  . Unstable angina (HCC) 04/28/2013  . Cigarette smoker 04/28/2013    Past Surgical History:  Procedure Laterality Date  . APPENDECTOMY    . CORONARY ARTERY BYPASS GRAFT N/A 05/04/2013   Procedure: CORONARY ARTERY BYPASS GRAFTING times five using Right Greater Saphenous Vein Graft harvested endoscopically and Left Internal Mammary Artery;  Surgeon: Kerin Perna, MD;  Location: [facility] OR;  Service: Open Heart Surgery;  Laterality: N/A;  . INTRAOPERATIVE TRANSESOPHAGEAL ECHOCARDIOGRAM N/A 05/04/2013   Procedure: INTRAOPERATIVE TRANSESOPHAGEAL ECHOCARDIOGRAM;  Surgeon: Kerin Perna, MD;  Location: [facility] OR;   Service: Open Heart Surgery;  Laterality: N/A;  . INTRAVASCULAR ULTRASOUND  04/30/2013   Procedure: INTRAVASCULAR ULTRASOUND;  Surgeon: Tonny Bollman, MD;  Location: [facility] CATH LAB;  Service: Cardiovascular;;  . LEFT HEART CATHETERIZATION WITH CORONARY ANGIOGRAM N/A 04/30/2013   Procedure: LEFT HEART CATHETERIZATION WITH CORONARY ANGIOGRAM;  Surgeon: Tonny Bollman, MD;  Location: [facility] CATH LAB;  Service: Cardiovascular;  Laterality: N/A;       Family History  Adopted: Yes    Social History   Tobacco Use  . Smoking status: Current Every Day Smoker    Packs/day: 1.50    Years: 35.00    Pack years: 52.50    Types: Cigarettes  Substance Use Topics  . Alcohol use: No  . Drug use: No    Home Medications Prior to Admission medications   Medication Sig Start Date End Date Taking? Authorizing Provider  aspirin EC 325 MG tablet Take 325 mg by mouth every morning.    [provider]  atorvastatin (LIPITOR) 40 MG tablet Take 1 tablet (40 mg total) by mouth daily at 6 PM. Patient not taking: Reported on 03/03/2021 05/10/13   Barrett, Rae Roam, PA-C  budesonide-formoterol (SYMBICORT) 160-4.5 MCG/ACT inhaler Inhale 2 puffs into the lungs 2 (two) times daily. Patient not taking: Reported on 03/03/2021 05/10/13   Barrett, Rae Roam, PA-C  clopidogrel (PLAVIX) 75 MG tablet Take 1 tablet (75 mg total) by mouth daily. 01/29/16   Elson Areas, PA-C  dexamethasone (DECADRON) 4 MG  tablet Take 1 tablet (4 mg total) by mouth 2 (two) times daily with a meal. Patient not taking: Reported on 03/03/2021 12/27/14   Ivery Quale, PA-C  diclofenac (VOLTAREN) 75 MG EC tablet Take 1 tablet (75 mg total) by mouth 2 (two) times daily. Patient not taking: Reported on 03/03/2021 12/27/14   Ivery Quale, PA-C  diphenhydramine-acetaminophen (TYLENOL PM) 25-500 MG TABS Take 2 tablets by mouth at bedtime.    [provider]  HYDROcodone-acetaminophen (NORCO) 10-325 MG per tablet Take 1 tablet by mouth every 4 (four)  hours as needed for pain. Patient not taking: No sig reported 05/28/13   Donata Clay, Theron Arista, MD  ibuprofen (ADVIL,MOTRIN) 200 MG tablet Take 1,000 mg by mouth every 8 (eight) hours as needed (pain).    [provider]  lisinopril (PRINIVIL,ZESTRIL) 10 MG tablet Take 1 tablet (10 mg total) by mouth daily. Patient not taking: Reported on 03/03/2021 01/29/16   Elson Areas, PA-C  metoprolol tartrate (LOPRESSOR) 25 MG tablet Take 0.5 tablets (12.5 mg total) by mouth 2 (two) times daily. Patient not taking: Reported on 03/03/2021 01/29/16   Elson Areas, PA-C    Allergies    Patient has no known allergies.  Review of Systems   Review of Systems  Respiratory: Positive for shortness of breath.   Cardiovascular: Positive for chest pain.  All other systems reviewed and are negative.   Physical Exam Updated Vital Signs BP (!) 154/71   Pulse 73   Temp 98.9 F (37.2 C) (Oral)   Resp (!) 22   Ht 5\' 11"  (1.803 m)   Wt 85.3 kg   SpO2 98%   BMI 26.22 kg/m   Physical Exam Vitals and nursing note reviewed.  Constitutional:      Appearance: He is well-developed.  HENT:     Head: Normocephalic and atraumatic.  Eyes:     Conjunctiva/sclera: Conjunctivae normal.  Cardiovascular:     Rate and Rhythm: Normal rate and regular rhythm.     Heart sounds: Normal heart sounds. No murmur heard.   Pulmonary:     Effort: Pulmonary effort is normal. No respiratory distress.     Breath sounds: Normal breath sounds.  Abdominal:     Palpations: Abdomen is soft.     Tenderness: There is no abdominal tenderness.  Musculoskeletal:     Cervical back: Neck supple.  Skin:    General: Skin is warm and dry.  Neurological:     General: No focal deficit present.     Mental Status: He is alert.  Psychiatric:        Mood and Affect: Mood normal.     ED Results / Procedures / Treatments   Labs (all labs ordered are listed, but only abnormal results are displayed) Labs Reviewed  CBC WITH  DIFFERENTIAL/PLATELET - Abnormal; Notable for the following components:      Result Value   Monocytes Absolute 1.3 (*)    All other components within normal limits  COMPREHENSIVE METABOLIC PANEL - Abnormal; Notable for the following components:   Glucose, Bld 116 (*)    BUN 24 (*)    All other components within normal limits  TROPONIN I (HIGH SENSITIVITY) - Abnormal; Notable for the following components:   Troponin I (High Sensitivity) 6,425 (*)    All other components within normal limits    EKG None  Radiology DG Chest Port 1 View  Result Date: 03/03/2021 CLINICAL DATA:  Chest pain for 4 days, cough. EXAM: PORTABLE  CHEST 1 VIEW COMPARISON:  06/02/2013. FINDINGS: Trachea is midline. Heart size is accentuated by AP technique. Thoracic aorta is calcified. Lungs are clear. No pleural fluid. IMPRESSION: No acute findings. Electronically Signed   By: Leanna Battles M.D.   On: 03/03/2021 10:29    Procedures .Critical Care Performed by: Elson Areas, PA-C Authorized by: Elson Areas, PA-C   Comments:     Critical by Dr. Deretha Emory     Medications Ordered in ED Medications  albuterol (VENTOLIN HFA) 108 (90 Base) MCG/ACT inhaler 4 puff (4 puffs Inhalation Given 03/03/21 1039)    ED Course  I have reviewed the triage vital signs and the nursing notes.  Pertinent labs & imaging results that were available during my care of the patient were reviewed by me and considered in my medical decision making (see chart for details).    MDM Rules/Calculators/A&P                          MDM: EKG st depression lateral leads, Troponin 6425.  Consult to cardiologist  Pt given asa. Heparin ordered.    I spoke to cardiology who will see here.   Final Clinical Impression(s) / ED Diagnoses Final diagnoses:  NSTEMI (non-ST elevated myocardial infarction) [facility])  Atypical chest pain    Rx / DC Orders ED Discharge Orders    None       Elson Areas, PA-C 03/03/21 1432    Elson Areas, [place] 03/03/21 1433    Vanetta Mulders, MD 03/14/21 [phone]

## 2021-03-03 NOTE — ED Triage Notes (Signed)
Pt c/o cp that started 4 days ago. Pt states hes not really hurting today its "off and on". Pt states his wife died a year and a half ago and since then he hasnt taken any of his meds.

## 2021-03-04 ENCOUNTER — Observation Stay (HOSPITAL_COMMUNITY): Payer: PRIVATE HEALTH INSURANCE

## 2021-03-04 DIAGNOSIS — I5021 Acute systolic (congestive) heart failure: Secondary | ICD-10-CM

## 2021-03-04 DIAGNOSIS — I214 Non-ST elevation (NSTEMI) myocardial infarction: Secondary | ICD-10-CM | POA: Diagnosis not present

## 2021-03-04 DIAGNOSIS — J449 Chronic obstructive pulmonary disease, unspecified: Secondary | ICD-10-CM

## 2021-03-04 LAB — BASIC METABOLIC PANEL
Anion gap: 9 (ref 5–15)
BUN: 20 mg/dL (ref 8–23)
CO2: 23 mmol/L (ref 22–32)
Calcium: 8.5 mg/dL — ABNORMAL LOW (ref 8.9–10.3)
Chloride: 103 mmol/L (ref 98–111)
Creatinine, Ser: 1.01 mg/dL (ref 0.61–1.24)
GFR, Estimated: >60 mL/min (ref >60)
Glucose, Bld: 109 mg/dL — ABNORMAL HIGH (ref 70–99)
Potassium: 3.7 mmol/L (ref 3.5–5.1)
Sodium: 135 mmol/L (ref 135–145)

## 2021-03-04 LAB — LIPID PANEL
Cholesterol: 154 mg/dL (ref 0–200)
HDL: 24 mg/dL — ABNORMAL LOW (ref >40)
LDL Cholesterol: 101 mg/dL — ABNORMAL HIGH (ref 0–99)
Total CHOL/HDL Ratio: 6.4 RATIO
Triglycerides: 143 mg/dL (ref <150)
VLDL: 29 mg/dL (ref 0–40)

## 2021-03-04 LAB — CBC
HCT: 34.5 % — ABNORMAL LOW (ref 39.0–52.0)
Hemoglobin: 11.9 g/dL — ABNORMAL LOW (ref 13.0–17.0)
MCH: 30.7 pg (ref 26.0–34.0)
MCHC: 34.5 g/dL (ref 30.0–36.0)
MCV: 88.9 fL (ref 80.0–100.0)
Platelets: 185 10*3/uL (ref 150–400)
RBC: 3.88 MIL/uL — ABNORMAL LOW (ref 4.22–5.81)
RDW: 12.7 % (ref 11.5–15.5)
WBC: 8.8 10*3/uL (ref 4.0–10.5)
nRBC: 0 % (ref 0.0–0.2)

## 2021-03-04 MED ORDER — ENOXAPARIN SODIUM 40 MG/0.4ML ~~LOC~~ SOLN
40.0000 mg | SUBCUTANEOUS | Status: DC
  Administered 2021-03-04: 40 mg via SUBCUTANEOUS
  Filled 2021-03-04: qty 0.4

## 2021-03-04 MED ORDER — METOPROLOL SUCCINATE ER 25 MG PO TB24
25.0000 mg | ORAL_TABLET | Freq: Every day | ORAL | Status: DC
  Administered 2021-03-04 – 2021-03-07 (×4): 25 mg via ORAL
  Filled 2021-03-04 (×4): qty 1

## 2021-03-04 MED ORDER — FUROSEMIDE 10 MG/ML IJ SOLN
20.0000 mg | Freq: Once | INTRAMUSCULAR | Status: AC
  Administered 2021-03-04: 20 mg via INTRAVENOUS
  Filled 2021-03-04: qty 2

## 2021-03-04 MED ORDER — MOMETASONE FURO-FORMOTEROL FUM 200-5 MCG/ACT IN AERO
2.0000 | INHALATION_SPRAY | Freq: Two times a day (BID) | RESPIRATORY_TRACT | Status: DC
  Administered 2021-03-04 – 2021-03-07 (×6): 2 via RESPIRATORY_TRACT
  Filled 2021-03-04: qty 8.8

## 2021-03-04 MED ORDER — LOSARTAN POTASSIUM 25 MG PO TABS
25.0000 mg | ORAL_TABLET | Freq: Every day | ORAL | Status: DC
  Administered 2021-03-04 – 2021-03-06 (×3): 25 mg via ORAL
  Filled 2021-03-04 (×3): qty 1

## 2021-03-04 NOTE — Progress Notes (Signed)
TR BAND REMOVAL  LOCATION:    left radial  DEFLATED PER PROTOCOL:    Yes.    TIME BAND OFF / DRESSING APPLIED:    2130 SITE UPON ARRIVAL:    Level 0  SITE AFTER BAND REMOVAL:    Level 0  CIRCULATION SENSATION AND MOVEMENT:    Within Normal Limits   Yes.    COMMENTS:   Tolerated procedure well.

## 2021-03-04 NOTE — Progress Notes (Signed)
CARDIAC REHAB PHASE I   PRE:  Rate/Rhythm: NSR/PVC's 68   BP:  Sitting: 126/59  Standing: 130/60      SaO2: 98  MODE:  Ambulation: 470 ft   POST:  Rate/Rhythm: NSR 77  BP:  Sitting: 135/77      SaO2: 95  09:30 - 10:06  Pt received in bed, agrees to walk. Pt was on O2 @ 2L/min allowed;  to be on RA, SaO2 = 97. Pt ambulated with steady gait. Pt felt some SOB mid walk: SAO2 = 97 and HR 78, NSR. Pt felt better and finished walk w/o complaint. RN notified. Pt back to bedside and being taken to echo. Will follow-up for education.   Lorin Picket, MS, ACSM EP-C, [facility] 03/04/2021

## 2021-03-04 NOTE — Progress Notes (Signed)
1350 -1435  Provided education S/P NSTEMI/PCI. Reviewed MI and PCI. Provided MI booklet.  Instructed to carry stent cards. Reviewed RF for CVD, lipids, smoking, cholesterol/diet, sedentary lifestyle. Pt voices he quit smoking 5 days ago and wants to remain smoke-free. Provided smoking cessation education. Stressed the importance of medication compliance and f/u with MD after discharge. Discussed CRP2 @ [facility] and referral will be made. Reviewed NTG use and S/S to terminate activity. Pt verbalized understanding of the education provided. All questions answered.   Lorin Picket MS, ACSM-EP-C, CCRP

## 2021-03-04 NOTE — Progress Notes (Signed)
Cardiology Progress Note  Patient ID: Mark Carpenter MRN: [id number] DOB: [birth year] Date of Encounter: 03/04/2021  Primary Cardiologist: No primary care provider on file.  Subjective   Chief Complaint: Cough.  HPI: Admitted for non-STEMI yesterday.  Underwent PCI to the vein graft to an OM branch.  He will undergo PCI to the vein graft to the RCA on Monday.  ROS:  All other ROS reviewed and negative. Pertinent positives noted in the HPI.     Inpatient Medications  Scheduled Meds: . aspirin EC  81 mg Oral Daily  . atorvastatin  80 mg Oral Daily  . metoprolol tartrate  25 mg Oral BID  . nitroGLYCERIN  1 inch Topical Q6H  . sodium chloride flush  3 mL Intravenous Q12H  . ticagrelor  90 mg Oral BID   Continuous Infusions: . sodium chloride     PRN Meds: sodium chloride, acetaminophen, nitroGLYCERIN, ondansetron (ZOFRAN) IV, sodium chloride flush   Vital Signs   Vitals:   03/03/21 1810 03/03/21 2325 03/04/21 0632 03/04/21 0807  BP: (!) 147/81 115/63 121/63 134/82  Pulse: 72 66 69 67  Resp:  20 15 17   Temp:  97.7 F (36.5 C) 97.7 F (36.5 C) 98.4 F (36.9 C)  TempSrc:  Oral Oral Oral  SpO2: 98% 99% 99% 97%  Weight:   83.5 kg   Height:        Intake/Output Summary (Last 24 hours) at 03/04/2021 1045 Last data filed at 03/04/2021 0400 Gross per 24 hour  Intake 374.95 ml  Output 370 ml  Net 4.95 ml   Last 3 Weights 03/04/2021 03/03/2021 03/03/2021  Weight (lbs) 184 lb 182 lb 3.2 oz 188 lb  Weight (kg) 83.462 kg 82.645 kg 85.276 kg      Telemetry  Overnight telemetry shows sinus rhythm in the 60s, which I personally reviewed.   ECG  The most recent ECG shows sinus rhythm 67, anterolateral T wave inversions, which I personally reviewed.   Physical Exam   Vitals:   03/03/21 1810 03/03/21 2325 03/04/21 0632 03/04/21 0807  BP: (!) 147/81 115/63 121/63 134/82  Pulse: 72 66 69 67  Resp:  20 15 17   Temp:  97.7 F (36.5 C) 97.7 F (36.5 C) 98.4 F (36.9 C)   TempSrc:  Oral Oral Oral  SpO2: 98% 99% 99% 97%  Weight:   83.5 kg   Height:         Intake/Output Summary (Last 24 hours) at 03/04/2021 1045 Last data filed at 03/04/2021 0400 Gross per 24 hour  Intake 374.95 ml  Output 370 ml  Net 4.95 ml    Last 3 Weights 03/04/2021 03/03/2021 03/03/2021  Weight (lbs) 184 lb 182 lb 3.2 oz 188 lb  Weight (kg) 83.462 kg 82.645 kg 85.276 kg    Body mass index is 25.66 kg/m.   General: Well nourished, well developed, in no acute distress Head: Atraumatic, normal size  Eyes: PEERLA, EOMI  Neck: Supple, no JVD Endocrine: No thryomegaly Cardiac: Normal S1, S2; RRR; no murmurs, rubs, or gallops Lungs: Clear to auscultation bilaterally, no wheezing, rhonchi or rales  Abd: Soft, nontender, no hepatomegaly  Ext: No edema, pulses 2+, left radial cath site clean and dry without hematoma Musculoskeletal: No deformities, BUE and BLE strength normal and equal Skin: Warm and dry, no rashes   Neuro: Alert and oriented to person, place, time, and situation, CNII-XII grossly intact, no focal deficits  Psych: Normal mood and affect   Labs  High  Sensitivity Troponin:   Recent Labs  Lab 03/03/21 1003 03/03/21 1200  TROPONINIHS 6,425* 6,404*     Cardiac EnzymesNo results for input(s): TROPONINI in the last 168 hours. No results for input(s): TROPIPOC in the last 168 hours.  Chemistry Recent Labs  Lab 03/03/21 1003 03/04/21 0151  NA 138 135  K 4.5 3.7  CL 101 103  CO2 27 23  GLUCOSE 116* 109*  BUN 24* 20  CREATININE 1.00 1.01  CALCIUM 8.9 8.5*  PROT 6.9  --   ALBUMIN 3.6  --   AST 35  --   ALT 29  --   ALKPHOS 98  --   BILITOT 0.6  --   GFRNONAA >60 >60  ANIONGAP 10 9    Hematology Recent Labs  Lab 03/03/21 1003 03/04/21 0151  WBC 9.8 8.8  RBC 4.65 3.88*  HGB 14.1 11.9*  HCT 42.7 34.5*  MCV 91.8 88.9  MCH 30.3 30.7  MCHC 33.0 34.5  RDW 12.4 12.7  PLT 212 185   BNPNo results for input(s): BNP, PROBNP in the last 168 hours.   DDimer No results for input(s): DDIMER in the last 168 hours.   Radiology  CARDIAC CATHETERIZATION  Result Date: 03/03/2021  Prox RCA lesion is 90% stenosed.  Mid RCA lesion is 99% stenosed.  SVG graft was visualized by angiography.  Origin to Prox Graft lesion is 95% stenosed.  Ramus lesion is 100% stenosed.  SVG graft was visualized by angiography.  Origin to Prox Graft lesion is 100% stenosed.  LPAV lesion is 100% stenosed.  1st Mrg lesion is 100% stenosed.  Mid LM to Dist LM lesion is 60% stenosed.  Ost Cx to Mid Cx lesion is 60% stenosed.  LIMA graft was visualized by angiography.  Mid Graft lesion before 1st Mrg is 99% stenosed.  A drug-eluting stent was successfully placed using a SYNERGY XD 4.0X20.  Post intervention, there is a 0% residual stenosis.  1. Moderate to severe left main stenosis 2. Mild LAD disease. Patent LIMA to LAD 3. Moderate to severe ostial Circumflex disease with chronic occlusion of the intermediate branch, chronic occlusion of the distal Circumflex and second OM branch. The SVG to the intermediate is chronically occluded. The SVG sequential to the OM and distal Circumflex is patent but there is a severe mid body stenosis in the graft. 4. Dominant RCA with severe mid stenosis. Patent SVG to distal RCA. There is a severe stenosis in the proximal body of the SVG to rCA 5. Successful PTCA/DES x1  Mid body of SVG to OM/Circumflex with distal protection. Recommendations: Continue DAPT with ASA and Brilinta for one year. Will plan staged PCI of the SVG to RCA on Monday. I did not perform this today as he had no-reflow phenomenon post PCI of the SVT to OM/Circ.   DG Chest Port 1 View  Result Date: 03/03/2021 CLINICAL DATA:  Chest pain for 4 days, cough. EXAM: PORTABLE CHEST 1 VIEW COMPARISON:  06/02/2013. FINDINGS: Trachea is midline. Heart size is accentuated by AP technique. Thoracic aorta is calcified. Lungs are clear. No pleural fluid. IMPRESSION: No acute  findings. Electronically Signed   By: Leanna Battles M.D.   On: 03/03/2021 10:29   ECHOCARDIOGRAM COMPLETE  Result Date: 03/03/2021    ECHOCARDIOGRAM REPORT   Patient Name:   Mark Carpenter Date of Exam: 03/03/2021 Medical Rec #:  [id number]     Height:       71.0 in Accession #:  [id number]    Weight:       188.0 lb Date of Birth:  [birth year]     BSA:          2.054 m Patient Age:    65 years      BP:           160/87 mmHg Patient Gender: M             HR:           77 bpm. [facility] Procedure: 2D Echo Indications:    NSTEMI I21.4  History:        Patient has prior history of Echocardiogram examinations, most                 recent 05/04/2013. Previous Myocardial Infarction; Risk                 Factors:Current Smoker and Hypertension. Carotid Artery Bruit.  Sonographer:    Jeryl ColumbiaJohanna Elliott RDCS (AE) Referring Phys: [id number] Ellsworth LennoxBRITTANY M STRADER IMPRESSIONS  1. Left ventricular ejection fraction, by estimation, is 45 to 50%. The left ventricle has mildly decreased function. The left ventricle demonstrates regional wall motion abnormalities (see scoring diagram/findings for description). There is moderate left ventricular hypertrophy. Left ventricular diastolic parameters are consistent with Grade II diastolic dysfunction (pseudonormalization).  2. Right ventricular systolic function is normal. The right ventricular size is normal. There is mildly elevated pulmonary artery systolic pressure. The estimated right ventricular systolic pressure is 36.4 mmHg.  3. The mitral valve is grossly normal. Mild mitral valve regurgitation.  4. The aortic valve is tricuspid. There is moderate calcification of the aortic valve. Aortic valve regurgitation is not visualized. Mild to moderate aortic valve sclerosis/calcification is present, without any evidence of aortic stenosis.  5. The inferior vena cava is normal in size with greater than 50% respiratory variability, suggesting right atrial pressure of 3 mmHg.  FINDINGS  Left Ventricle: Left ventricular ejection fraction, by estimation, is 45 to 50%. The left ventricle has mildly decreased function. The left ventricle demonstrates regional wall motion abnormalities. The left ventricular internal cavity size was normal in size. There is moderate left ventricular hypertrophy. Left ventricular diastolic parameters are consistent with Grade II diastolic dysfunction (pseudonormalization).  LV Wall Scoring: The antero-lateral wall, inferior wall, and posterior wall are hypokinetic. The entire anterior wall, entire septum, and entire apex are normal. Right Ventricle: The right ventricular size is normal. No increase in right ventricular wall thickness. Right ventricular systolic function is normal. There is mildly elevated pulmonary artery systolic pressure. The tricuspid regurgitant velocity is 2.89  m/s, and with an assumed right atrial pressure of 3 mmHg, the estimated right ventricular systolic pressure is 36.4 mmHg. Left Atrium: Left atrial size was normal in size. Right Atrium: Right atrial size was normal in size. Pericardium: There is no evidence of pericardial effusion. Mitral Valve: The mitral valve is grossly normal. Mild mitral annular calcification. Mild mitral valve regurgitation. Tricuspid Valve: The tricuspid valve is grossly normal. Tricuspid valve regurgitation is trivial. Aortic Valve: The aortic valve is tricuspid. There is moderate calcification of the aortic valve. There is mild aortic valve annular calcification. Aortic valve regurgitation is not visualized. Mild to moderate aortic valve sclerosis/calcification is present, without any evidence of aortic stenosis. Pulmonic Valve: The pulmonic valve was grossly normal. Pulmonic valve regurgitation is trivial. Aorta: The aortic root is normal in size and structure. Venous: The inferior vena cava is normal in size with greater  than 50% respiratory variability, suggesting right atrial pressure of 3 mmHg.  IAS/Shunts: No atrial level shunt detected by color flow Doppler.  LEFT VENTRICLE PLAX 2D LVIDd:         5.09 cm  Diastology LVIDs:         4.35 cm  LV e' medial:    5.66 cm/s LV PW:         1.74 cm  LV E/e' medial:  14.6 LV IVS:        1.28 cm  LV e' lateral:   6.96 cm/s LVOT diam:     2.00 cm  LV E/e' lateral: 11.9 LVOT Area:     3.14 cm  RIGHT VENTRICLE RV S prime:     10.00 cm/s TAPSE (M-mode): 1.7 cm LEFT ATRIUM             Index       RIGHT ATRIUM           Index LA diam:        4.40 cm 2.14 cm/m  RA Area:     13.80 cm LA Vol (A2C):   69.4 ml 33.79 ml/m RA Volume:   35.30 ml  17.19 ml/m LA Vol (A4C):   34.2 ml 16.65 ml/m LA Biplane Vol: 52.5 ml 25.56 ml/m   AORTA Ao Root diam: 2.90 cm MITRAL VALVE               TRICUSPID VALVE MV Area (PHT): 2.71 cm    TR Peak grad:   33.4 mmHg MV Decel Time: 280 msec    TR Vmax:        289.00 cm/s MR Peak grad: 112.4 mmHg MR Mean grad: 74.0 mmHg    SHUNTS MR Vmax:      530.00 cm/s  Systemic Diam: 2.00 cm MR Vmean:     409.0 cm/s MV E velocity: 82.70 cm/s MV A velocity: 46.30 cm/s MV E/A ratio:  1.79 Nona Dell MD Electronically signed by Nona Dell MD Signature Date/Time: 03/03/2021/4:58:46 PM    Final     Cardiac Studies  TTE 03/03/2021 1. Left ventricular ejection fraction, by estimation, is 45 to 50%. The  left ventricle has mildly decreased function. The left ventricle  demonstrates regional wall motion abnormalities (see scoring  diagram/findings for description). There is moderate  left ventricular hypertrophy. Left ventricular diastolic parameters are  consistent with Grade II diastolic dysfunction (pseudonormalization).  2. Right ventricular systolic function is normal. The right ventricular  size is normal. There is mildly elevated pulmonary artery systolic  pressure. The estimated right ventricular systolic pressure is 36.4 mmHg.  3. The mitral valve is grossly normal. Mild mitral valve regurgitation.  4. The aortic valve is  tricuspid. There is moderate calcification of the  aortic valve. Aortic valve regurgitation is not visualized. Mild to  moderate aortic valve sclerosis/calcification is present, without any  evidence of aortic stenosis.  5. The inferior vena cava is normal in size with greater than 50%  respiratory variability, suggesting right atrial pressure of 3 mmHg.  LHC 03/03/2021  1. Moderate to severe left main stenosis 2. Mild LAD disease. Patent LIMA to LAD 3. Moderate to severe ostial Circumflex disease with chronic occlusion of the intermediate branch, chronic occlusion of the distal Circumflex and second OM branch. The SVG to the intermediate is chronically occluded. The SVG sequential to the OM and distal Circumflex is patent but there is a severe mid body stenosis in the graft.  4. Dominant RCA  with severe mid stenosis. Patent SVG to distal RCA. There is a severe stenosis in the proximal body of the SVG to rCA 5. Successful PTCA/DES x1  Mid body of SVG to OM/Circumflex with distal protection.      Patient Profile  Mark Carpenter is a 65 y.o. male with CAD status post CABG, tobacco abuse, hypertension, hyperlipidemia, COPD who was admitted on 03/03/2021 with non-STEMI.  Status post PCI to the vein graft to an OM branch.  Plan for staged PCI on Monday.  Assessment & Plan   1. NSTEMI/Prior CABG -Severe stenosis in a vein graft to the OM branch.  Treated with PCI.  Plan for staged intervention to the vein graft to the RCA on Monday. -Continue aspirin and Brilinta. -Nondiabetic.  LDL cholesterol is above 70.  He is on high intensity statin.  We will need close follow-up in the outpatient setting. -Continue metoprolol succinate 20 mg daily. -Denies any recurrent chest pain. -Telemetry stable. -Echo shows EF is 40 to 45%.  2.  Systolic heart failure, EF 40-45%/ischemic cardiomyopathy -LVEDP 17 mmHg.  We will give Lasix IV 20 mg today. -Transition metoprolol tartrate to metoprolol succinate 25  mg daily. -Add losartan 25 mg daily. -We will add ARB tomorrow pending blood pressure. -Should consider Jardiance. -Could also consider Entresto.  3. Tobacco abuse/COPD -home inhaler -albuterol PRN  4. HTN -HF meds as above  FEN -no IVF -code: full -diet: salt restricted -dvt ppx: lovenox  For questions or updates, please contact [facility] Please consult [email or web address] for contact info under   Time Spent with Patient: I have spent a total of 25 minutes with patient reviewing hospital notes, telemetry, EKGs, labs and examining the patient as well as establishing an assessment and plan that was discussed with the patient.  > 50% of time was spent in direct patient care.    Signed, Lenna Gilford. Flora Lipps, MD, [facility] [place]  [facility]  03/04/2021 10:45 AM

## 2021-03-05 DIAGNOSIS — I214 Non-ST elevation (NSTEMI) myocardial infarction: Secondary | ICD-10-CM | POA: Diagnosis not present

## 2021-03-05 LAB — CBC
HCT: 34.3 % — ABNORMAL LOW (ref 39.0–52.0)
Hemoglobin: 11.3 g/dL — ABNORMAL LOW (ref 13.0–17.0)
MCH: 29.9 pg (ref 26.0–34.0)
MCHC: 32.9 g/dL (ref 30.0–36.0)
MCV: 90.7 fL (ref 80.0–100.0)
Platelets: 183 10*3/uL (ref 150–400)
RBC: 3.78 MIL/uL — ABNORMAL LOW (ref 4.22–5.81)
RDW: 12.6 % (ref 11.5–15.5)
WBC: 9.9 10*3/uL (ref 4.0–10.5)
nRBC: 0 % (ref 0.0–0.2)

## 2021-03-05 MED ORDER — SODIUM CHLORIDE 0.9% FLUSH
3.0000 mL | Freq: Two times a day (BID) | INTRAVENOUS | Status: DC
  Administered 2021-03-05 – 2021-03-06 (×2): 3 mL via INTRAVENOUS

## 2021-03-05 MED ORDER — SODIUM CHLORIDE 0.9% FLUSH
3.0000 mL | INTRAVENOUS | Status: DC | PRN
  Administered 2021-03-05: 3 mL via INTRAVENOUS

## 2021-03-05 MED ORDER — SODIUM CHLORIDE 0.9 % IV SOLN: 250.0000 mL | INTRAVENOUS | Status: DC | PRN

## 2021-03-05 MED ORDER — SPIRONOLACTONE 12.5 MG HALF TABLET
12.5000 mg | ORAL_TABLET | Freq: Every day | ORAL | Status: DC
  Administered 2021-03-05: 12.5 mg via ORAL
  Filled 2021-03-05 (×2): qty 1

## 2021-03-05 MED ORDER — SODIUM CHLORIDE 0.9 % IV SOLN: INTRAVENOUS | Status: DC

## 2021-03-05 MED ORDER — ASPIRIN 81 MG PO CHEW
81.0000 mg | CHEWABLE_TABLET | ORAL | Status: AC
  Administered 2021-03-06: 81 mg via ORAL
  Filled 2021-03-05: qty 1

## 2021-03-05 NOTE — H&P (View-Only) (Signed)
Cardiology Progress Note  Patient ID: Mark Carpenter MRN: [id number] DOB: [birth year] Date of Encounter: 03/05/2021  Primary Cardiologist: No primary care provider on file.  Subjective   Chief Complaint: None.  HPI: Cough improved with diuresis.  No chest pain.  Awaiting left heart cath tomorrow.  ROS:  All other ROS reviewed and negative. Pertinent positives noted in the HPI.     Inpatient Medications  Scheduled Meds: . aspirin EC  81 mg Oral Daily  . atorvastatin  80 mg Oral Daily  . losartan  25 mg Oral Daily  . metoprolol succinate  25 mg Oral Daily  . mometasone-formoterol  2 puff Inhalation BID  . nitroGLYCERIN  1 inch Topical Q6H  . sodium chloride flush  3 mL Intravenous Q12H  . ticagrelor  90 mg Oral BID   Continuous Infusions: . sodium chloride     PRN Meds: sodium chloride, acetaminophen, nitroGLYCERIN, ondansetron (ZOFRAN) IV, sodium chloride flush   Vital Signs   Vitals:   03/04/21 0807 03/04/21 1119 03/04/21 2008 03/04/21 2121  BP: 134/82 129/63 132/61   Pulse: 67 66 83   Resp: Temp: 98.4 F (36.9 C)  97.9 F (36.6 C)   TempSrc: Oral  Oral   SpO2: 97% 99% 97% 97%  Weight:      Height:        Intake/Output Summary (Last 24 hours) at 03/05/2021 0809 Last data filed at 03/04/2021 1633 Gross per 24 hour  Intake 300 ml  Output 800 ml  Net -500 ml   Last 3 Weights 03/04/2021 03/03/2021 03/03/2021  Weight (lbs) 184 lb 182 lb 3.2 oz 188 lb  Weight (kg) 83.462 kg 82.645 kg 85.276 kg      Telemetry  Overnight telemetry shows sinus rhythm in the 70s with PVCs, which I personally reviewed.   ECG  The most recent ECG shows normal sinus rhythm heart rate 67, anterolateral ST depressions, single PVC, which I personally reviewed.   Physical Exam   Vitals:   03/04/21 0807 03/04/21 1119 03/04/21 2008 03/04/21 2121  BP: 134/82 129/63 132/61   Pulse: 67 66 83   Resp: Temp: 98.4 F (36.9 C)  97.9 F (36.6 C)   TempSrc: Oral  Oral    SpO2: 97% 99% 97% 97%  Weight:      Height:         Intake/Output Summary (Last 24 hours) at 03/05/2021 0809 Last data filed at 03/04/2021 1633 Gross per 24 hour  Intake 300 ml  Output 800 ml  Net -500 ml    Last 3 Weights 03/04/2021 03/03/2021 03/03/2021  Weight (lbs) 184 lb 182 lb 3.2 oz 188 lb  Weight (kg) 83.462 kg 82.645 kg 85.276 kg    Body mass index is 25.66 kg/m.   General: Well nourished, well developed, in no acute distress Head: Atraumatic, normal size  Eyes: PEERLA, EOMI  Neck: Supple, no JVD Endocrine: No thryomegaly Cardiac: Normal S1, S2; RRR; no murmurs, rubs, or gallops Lungs: Clear to auscultation bilaterally, no wheezing, rhonchi or rales  Abd: Soft, nontender, no hepatomegaly  Ext: No edema, pulses 2+, left radial cath site clean and dry Musculoskeletal: No deformities, BUE and BLE strength normal and equal Skin: Warm and dry, no rashes   Neuro: Alert and oriented to person, place, time, and situation, CNII-XII grossly intact, no focal deficits  Psych: Normal mood and affect   Labs  High Sensitivity Troponin:   Recent  Labs  Lab 03/03/21 1003 03/03/21 1200  TROPONINIHS 6,425* 6,404*     Cardiac EnzymesNo results for input(s): TROPONINI in the last 168 hours. No results for input(s): TROPIPOC in the last 168 hours.  Chemistry Recent Labs  Lab 03/03/21 1003 03/04/21 0151  NA 138 135  K 4.5 3.7  CL 101 103  CO2 27 23  GLUCOSE 116* 109*  BUN 24* 20  CREATININE 1.00 1.01  CALCIUM 8.9 8.5*  PROT 6.9  --   ALBUMIN 3.6  --   AST 35  --   ALT 29  --   ALKPHOS 98  --   BILITOT 0.6  --   GFRNONAA >60 >60  ANIONGAP 10 9    Hematology Recent Labs  Lab 03/03/21 1003 03/04/21 0151 03/05/21 0337  WBC 9.8 8.8 9.9  RBC 4.65 3.88* 3.78*  HGB 14.1 11.9* 11.3*  HCT 42.7 34.5* 34.3*  MCV 91.8 88.9 90.7  MCH 30.3 30.7 29.9  MCHC 33.0 34.5 32.9  RDW 12.4 12.7 12.6  PLT 212 185 183   BNPNo results for input(s): BNP, PROBNP in the last 168 hours.   DDimer No results for input(s): DDIMER in the last 168 hours.   Radiology  CARDIAC CATHETERIZATION  Result Date: 03/03/2021  Prox RCA lesion is 90% stenosed.  Mid RCA lesion is 99% stenosed.  SVG graft was visualized by angiography.  Origin to Prox Graft lesion is 95% stenosed.  Ramus lesion is 100% stenosed.  SVG graft was visualized by angiography.  Origin to Prox Graft lesion is 100% stenosed.  LPAV lesion is 100% stenosed.  1st Mrg lesion is 100% stenosed.  Mid LM to Dist LM lesion is 60% stenosed.  Ost Cx to Mid Cx lesion is 60% stenosed.  LIMA graft was visualized by angiography.  Mid Graft lesion before 1st Mrg is 99% stenosed.  A drug-eluting stent was successfully placed using a SYNERGY XD 4.0X20.  Post intervention, there is a 0% residual stenosis.  1. Moderate to severe left main stenosis 2. Mild LAD disease. Patent LIMA to LAD 3. Moderate to severe ostial Circumflex disease with chronic occlusion of the intermediate branch, chronic occlusion of the distal Circumflex and second OM branch. The SVG to the intermediate is chronically occluded. The SVG sequential to the OM and distal Circumflex is patent but there is a severe mid body stenosis in the graft. 4. Dominant RCA with severe mid stenosis. Patent SVG to distal RCA. There is a severe stenosis in the proximal body of the SVG to rCA 5. Successful PTCA/DES x1  Mid body of SVG to OM/Circumflex with distal protection. Recommendations: Continue DAPT with ASA and Brilinta for one year. Will plan staged PCI of the SVG to RCA on Monday. I did not perform this today as he had no-reflow phenomenon post PCI of the SVT to OM/Circ.   DG Chest Port 1 View  Result Date: 03/03/2021 CLINICAL DATA:  Chest pain for 4 days, cough. EXAM: PORTABLE CHEST 1 VIEW COMPARISON:  06/02/2013. FINDINGS: Trachea is midline. Heart size is accentuated by AP technique. Thoracic aorta is calcified. Lungs are clear. No pleural fluid. IMPRESSION: No acute  findings. Electronically Signed   By: Leanna Battles M.D.   On: 03/03/2021 10:29   ECHOCARDIOGRAM COMPLETE  Result Date: 03/03/2021    ECHOCARDIOGRAM REPORT   Patient Name:   Mark Carpenter Date of Exam: 03/03/2021 Medical Rec #:  [id number]     Height:       71.0  in Accession #:    [id number]    Weight:       188.0 lb Date of Birth:  Sep 01, 1956     BSA:          2.054 m Patient Age:    65 years      BP:           160/87 mmHg Patient Gender: M             HR:           77 bpm. [facility] Procedure: 2D Echo Indications:    NSTEMI I21.4  History:        Patient has prior history of Echocardiogram examinations, most                 recent 05/04/2013. Previous Myocardial Infarction; Risk                 Factors:Current Smoker and Hypertension. Carotid Artery Bruit.  Sonographer:    Jeryl Columbia RDCS (AE) Referring Phys: [id number] [place]sworth Lennox IMPRESSIONS  1. Left ventricular ejection fraction, by estimation, is 45 to 50%. The left ventricle has mildly decreased function. The left ventricle demonstrates regional wall motion abnormalities (see scoring diagram/findings for description). There is moderate left ventricular hypertrophy. Left ventricular diastolic parameters are consistent with Grade II diastolic dysfunction (pseudonormalization).  2. Right ventricular systolic function is normal. The right ventricular size is normal. There is mildly elevated pulmonary artery systolic pressure. The estimated right ventricular systolic pressure is 36.4 mmHg.  3. The mitral valve is grossly normal. Mild mitral valve regurgitation.  4. The aortic valve is tricuspid. There is moderate calcification of the aortic valve. Aortic valve regurgitation is not visualized. Mild to moderate aortic valve sclerosis/calcification is present, without any evidence of aortic stenosis.  5. The inferior vena cava is normal in size with greater than 50% respiratory variability, suggesting right atrial pressure of 3 mmHg.  FINDINGS  Left Ventricle: Left ventricular ejection fraction, by estimation, is 45 to 50%. The left ventricle has mildly decreased function. The left ventricle demonstrates regional wall motion abnormalities. The left ventricular internal cavity size was normal in size. There is moderate left ventricular hypertrophy. Left ventricular diastolic parameters are consistent with Grade II diastolic dysfunction (pseudonormalization).  LV Wall Scoring: The antero-lateral wall, inferior wall, and posterior wall are hypokinetic. The entire anterior wall, entire septum, and entire apex are normal. Right Ventricle: The right ventricular size is normal. No increase in right ventricular wall thickness. Right ventricular systolic function is normal. There is mildly elevated pulmonary artery systolic pressure. The tricuspid regurgitant velocity is 2.89  m/s, and with an assumed right atrial pressure of 3 mmHg, the estimated right ventricular systolic pressure is 36.4 mmHg. Left Atrium: Left atrial size was normal in size. Right Atrium: Right atrial size was normal in size. Pericardium: There is no evidence of pericardial effusion. Mitral Valve: The mitral valve is grossly normal. Mild mitral annular calcification. Mild mitral valve regurgitation. Tricuspid Valve: The tricuspid valve is grossly normal. Tricuspid valve regurgitation is trivial. Aortic Valve: The aortic valve is tricuspid. There is moderate calcification of the aortic valve. There is mild aortic valve annular calcification. Aortic valve regurgitation is not visualized. Mild to moderate aortic valve sclerosis/calcification is present, without any evidence of aortic stenosis. Pulmonic Valve: The pulmonic valve was grossly normal. Pulmonic valve regurgitation is trivial. Aorta: The aortic root is normal in size and structure. Venous: The inferior vena cava  is normal in size with greater than 50% respiratory variability, suggesting right atrial pressure of 3 mmHg.  IAS/Shunts: No atrial level shunt detected by color flow Doppler.  LEFT VENTRICLE PLAX 2D LVIDd:         5.09 cm  Diastology LVIDs:         4.35 cm  LV e' medial:    5.66 cm/s LV PW:         1.74 cm  LV E/e' medial:  14.6 LV IVS:        1.28 cm  LV e' lateral:   6.96 cm/s LVOT diam:     2.00 cm  LV E/e' lateral: 11.9 LVOT Area:     3.14 cm  RIGHT VENTRICLE RV S prime:     10.00 cm/s TAPSE (M-mode): 1.7 cm LEFT ATRIUM             Index       RIGHT ATRIUM           Index LA diam:        4.40 cm 2.14 cm/m  RA Area:     13.80 cm LA Vol (A2C):   69.4 ml 33.79 ml/m RA Volume:   35.30 ml  17.19 ml/m LA Vol (A4C):   34.2 ml 16.65 ml/m LA Biplane Vol: 52.5 ml 25.56 ml/m   AORTA Ao Root diam: 2.90 cm MITRAL VALVE               TRICUSPID VALVE MV Area (PHT): 2.71 cm    TR Peak grad:   33.4 mmHg MV Decel Time: 280 msec    TR Vmax:        289.00 cm/s MR Peak grad: 112.4 mmHg MR Mean grad: 74.0 mmHg    SHUNTS MR Vmax:      530.00 cm/s  Systemic Diam: 2.00 cm MR Vmean:     409.0 cm/s MV E velocity: 82.70 cm/s MV A velocity: 46.30 cm/s MV E/A ratio:  1.79 Nona DellSamuel Mcdowell MD Electronically signed by Nona DellSamuel Mcdowell MD Signature Date/Time: 03/03/2021/4:58:46 PM    Final     Cardiac Studies  LHC 03/03/2021  1. Moderate to severe left main stenosis 2. Mild LAD disease. Patent LIMA to LAD 3. Moderate to severe ostial Circumflex disease with chronic occlusion of the intermediate branch, chronic occlusion of the distal Circumflex and second OM branch. The SVG to the intermediate is chronically occluded. The SVG sequential to the OM and distal Circumflex is patent but there is a severe mid body stenosis in the graft.  4. Dominant RCA with severe mid stenosis. Patent SVG to distal RCA. There is a severe stenosis in the proximal body of the SVG to rCA 5. Successful PTCA/DES x1  Mid body of SVG to OM/Circumflex with distal protection.   Recommendations: Continue DAPT with ASA and Brilinta for one year. Will plan staged PCI  of the SVG to RCA on Monday. I did not perform this today as he had no-reflow phenomenon post PCI of the SVT to OM/Circ.   TTE 03/03/2021 1. Left ventricular ejection fraction, by estimation, is 45 to 50%. The  left ventricle has mildly decreased function. The left ventricle  demonstrates regional wall motion abnormalities (see scoring  diagram/findings for description). There is moderate  left ventricular hypertrophy. Left ventricular diastolic parameters are  consistent with Grade II diastolic dysfunction (pseudonormalization).  2. Right ventricular systolic function is normal. The right ventricular  size is normal. There is mildly elevated pulmonary artery systolic  pressure. The estimated right ventricular systolic pressure is 36.4 mmHg.  3. The mitral valve is grossly normal. Mild mitral valve regurgitation.  4. The aortic valve is tricuspid. There is moderate calcification of the  aortic valve. Aortic valve regurgitation is not visualized. Mild to  moderate aortic valve sclerosis/calcification is present, without any  evidence of aortic stenosis.  5. The inferior vena cava is normal in size with greater than 50%  respiratory variability, suggesting right atrial pressure of 3 mmHg.    Patient Profile  Mark Carpenter is a 65 y.o. male with CAD status post CABG, tobacco abuse, hypertension, hyperlipidemia, COPD who was admitted on 03/03/2021 with non-STEMI.  Status post PCI to the vein graft to an OM branch.  Plan for staged PCI on Monday.  Assessment & Plan   1.  Non-STEMI -Severe stenosis in the vein graft to the OM branch.  Treated with PCI on 03/03/2021.  Staged intervention to vein graft to the RCA tomorrow.  N.p.o. at midnight. -Continue aspirin Brilinta. -He is not diabetic.  LDL is not at goal.  Continue high intensity statin.  Close follow-up as an outpatient. -He just recently quit smoking. -Continue metoprolol succinate 25 mg daily. -Denies chest pain.  Shared  Decision Making/Informed Consent The risks [stroke (1 in 1000), death (1 in 1000), kidney failure [usually temporary] (1 in 500), bleeding (1 in 200), allergic reaction [possibly serious] (1 in 200)], benefits (diagnostic support and management of coronary artery disease) and alternatives of a cardiac catheterization were discussed in detail with Mr. Villarruel and he is willing to proceed.  2.  Systolic heart failure, EF 40-45%/ischemic cardiomyopathy -Had a cough yesterday.  Was given 20 mg IV Lasix due to elevated LVEDP on left heart cath.  Cough is improved. -Metoprolol succinate 25 mg daily. -Losartan 25 mg daily added yesterday.  Could consider Entresto.  Cost may be an issue. -We will add Aldactone 12.5 mg daily. -Consider Jardiance at discharge.  3.  Tobacco abuse/COPD -Home inhaler.  Albuterol as needed.  4.  Hypertension -Medications as above.  FEN -No intravenous fluids -Code: Full -DVT PPx: Lovenox -Diet: Salt restricted, n.p.o. midnight.  For questions or updates, please contact [facility] Please consult [email or web address] for contact info under   Time Spent with Patient: I have spent a total of 25 minutes with patient reviewing hospital notes, telemetry, EKGs, labs and examining the patient as well as establishing an assessment and plan that was discussed with the patient.  > 50% of time was spent in direct patient care.    Signed, Lenna Gilford. Flora Lipps, MD, [facility]  [facility]  03/05/2021 8:09 AM

## 2021-03-05 NOTE — Progress Notes (Addendum)
Cardiology Progress Note  Patient ID: Mark Carpenter MRN: [id number] DOB: 08/25/1956 Date of Encounter: 03/05/2021  Primary Cardiologist: No primary care provider on file.  Subjective   Chief Complaint: None.  HPI: Cough improved with diuresis.  No chest pain.  Awaiting left heart cath tomorrow.  ROS:  All other ROS reviewed and negative. Pertinent positives noted in the HPI.     Inpatient Medications  Scheduled Meds: . aspirin EC  81 mg Oral Daily  . atorvastatin  80 mg Oral Daily  . losartan  25 mg Oral Daily  . metoprolol succinate  25 mg Oral Daily  . mometasone-formoterol  2 puff Inhalation BID  . nitroGLYCERIN  1 inch Topical Q6H  . sodium chloride flush  3 mL Intravenous Q12H  . ticagrelor  90 mg Oral BID   Continuous Infusions: . sodium chloride     PRN Meds: sodium chloride, acetaminophen, nitroGLYCERIN, ondansetron (ZOFRAN) IV, sodium chloride flush   Vital Signs   Vitals:   03/04/21 0807 03/04/21 1119 03/04/21 2008 03/04/21 2121  BP: 134/82 129/63 132/61   Pulse: 67 66 83   Resp: 17 16 18   Temp: 98.4 F (36.9 C)  97.9 F (36.6 C)   TempSrc: Oral  Oral   SpO2: 97% 99% 97% 97%  Weight:      Height:        Intake/Output Summary (Last 24 hours) at 03/05/2021 0809 Last data filed at 03/04/2021 1633 Gross per 24 hour  Intake 300 ml  Output 800 ml  Net -500 ml   Last 3 Weights 03/04/2021 03/03/2021 03/03/2021  Weight (lbs) 184 lb 182 lb 3.2 oz 188 lb  Weight (kg) 83.462 kg 82.645 kg 85.276 kg      Telemetry  Overnight telemetry shows sinus rhythm in the 70s with PVCs, which I personally reviewed.   ECG  The most recent ECG shows normal sinus rhythm heart rate 67, anterolateral ST depressions, single PVC, which I personally reviewed.   Physical Exam   Vitals:   03/04/21 0807 03/04/21 1119 03/04/21 2008 03/04/21 2121  BP: 134/82 129/63 132/61   Pulse: 67 66 83   Resp: 17 16 18   Temp: 98.4 F (36.9 C)  97.9 F (36.6 C)   TempSrc: Oral  Oral    SpO2: 97% 99% 97% 97%  Weight:      Height:         Intake/Output Summary (Last 24 hours) at 03/05/2021 0809 Last data filed at 03/04/2021 1633 Gross per 24 hour  Intake 300 ml  Output 800 ml  Net -500 ml    Last 3 Weights 03/04/2021 03/03/2021 03/03/2021  Weight (lbs) 184 lb 182 lb 3.2 oz 188 lb  Weight (kg) 83.462 kg 82.645 kg 85.276 kg    Body mass index is 25.66 kg/m.   General: Well nourished, well developed, in no acute distress Head: Atraumatic, normal size  Eyes: PEERLA, EOMI  Neck: Supple, no JVD Endocrine: No thryomegaly Cardiac: Normal S1, S2; RRR; no murmurs, rubs, or gallops Lungs: Clear to auscultation bilaterally, no wheezing, rhonchi or rales  Abd: Soft, nontender, no hepatomegaly  Ext: No edema, pulses 2+, left radial cath site clean and dry Musculoskeletal: No deformities, BUE and BLE strength normal and equal Skin: Warm and dry, no rashes   Neuro: Alert and oriented to person, place, time, and situation, CNII-XII grossly intact, no focal deficits  Psych: Normal mood and affect   Labs  High Sensitivity Troponin:   Recent   Labs  Lab 03/03/21 1003 03/03/21 1200  TROPONINIHS 6,425* 6,404*     Cardiac EnzymesNo results for input(s): TROPONINI in the last 168 hours. No results for input(s): TROPIPOC in the last 168 hours.  Chemistry Recent Labs  Lab 03/03/21 1003 03/04/21 0151  NA 138 135  K 4.5 3.7  CL 101 103  CO2 27 23  GLUCOSE 116* 109*  BUN 24* 20  CREATININE 1.00 1.01  CALCIUM 8.9 8.5*  PROT 6.9  --   ALBUMIN 3.6  --   AST 35  --   ALT 29  --   ALKPHOS 98  --   BILITOT 0.6  --   GFRNONAA >60 >60  ANIONGAP 10 9    Hematology Recent Labs  Lab 03/03/21 1003 03/04/21 0151 03/05/21 0337  WBC 9.8 8.8 9.9  RBC 4.65 3.88* 3.78*  HGB 14.1 11.9* 11.3*  HCT 42.7 34.5* 34.3*  MCV 91.8 88.9 90.7  MCH 30.3 30.7 29.9  MCHC 33.0 34.5 32.9  RDW 12.4 12.7 12.6  PLT 212 185 183   BNPNo results for input(s): BNP, PROBNP in the last 168 hours.   DDimer No results for input(s): DDIMER in the last 168 hours.   Radiology  CARDIAC CATHETERIZATION  Result Date: 03/03/2021  Prox RCA lesion is 90% stenosed.  Mid RCA lesion is 99% stenosed.  SVG graft was visualized by angiography.  Origin to Prox Graft lesion is 95% stenosed.  Ramus lesion is 100% stenosed.  SVG graft was visualized by angiography.  Origin to Prox Graft lesion is 100% stenosed.  LPAV lesion is 100% stenosed.  1st Mrg lesion is 100% stenosed.  Mid LM to Dist LM lesion is 60% stenosed.  Ost Cx to Mid Cx lesion is 60% stenosed.  LIMA graft was visualized by angiography.  Mid Graft lesion before 1st Mrg is 99% stenosed.  A drug-eluting stent was successfully placed using a SYNERGY XD 4.0X20.  Post intervention, there is a 0% residual stenosis.  1. Moderate to severe left main stenosis 2. Mild LAD disease. Patent LIMA to LAD 3. Moderate to severe ostial Circumflex disease with chronic occlusion of the intermediate branch, chronic occlusion of the distal Circumflex and second OM branch. The SVG to the intermediate is chronically occluded. The SVG sequential to the OM and distal Circumflex is patent but there is a severe mid body stenosis in the graft. 4. Dominant RCA with severe mid stenosis. Patent SVG to distal RCA. There is a severe stenosis in the proximal body of the SVG to rCA 5. Successful PTCA/DES x1  Mid body of SVG to OM/Circumflex with distal protection. Recommendations: Continue DAPT with ASA and Brilinta for one year. Will plan staged PCI of the SVG to RCA on Monday. I did not perform this today as he had no-reflow phenomenon post PCI of the SVT to OM/Circ.   DG Chest Port 1 View  Result Date: 03/03/2021 CLINICAL DATA:  Chest pain for 4 days, cough. EXAM: PORTABLE CHEST 1 VIEW COMPARISON:  06/02/2013. FINDINGS: Trachea is midline. Heart size is accentuated by AP technique. Thoracic aorta is calcified. Lungs are clear. No pleural fluid. IMPRESSION: No acute  findings. Electronically Signed   By: Leanna Battles M.D.   On: 03/03/2021 10:29   ECHOCARDIOGRAM COMPLETE  Result Date: 03/03/2021    ECHOCARDIOGRAM REPORT   Patient Name:   Mark Carpenter Date of Exam: 03/03/2021 Medical Rec #:  [id number]     Height:       71.0  in Accession #:    [id number]    Weight:       188.0 lb Date of Birth:  Sep 01, 1956     BSA:          2.054 m Patient Age:    65 years      BP:           160/87 mmHg Patient Gender: M             HR:           77 bpm. [facility] Procedure: 2D Echo Indications:    NSTEMI I21.4  History:        Patient has prior history of Echocardiogram examinations, most                 recent 05/04/2013. Previous Myocardial Infarction; Risk                 Factors:Current Smoker and Hypertension. Carotid Artery Bruit.  Sonographer:    Jeryl Columbia RDCS (AE) Referring Phys: [id number] Ellsworth Lennox IMPRESSIONS  1. Left ventricular ejection fraction, by estimation, is 45 to 50%. The left ventricle has mildly decreased function. The left ventricle demonstrates regional wall motion abnormalities (see scoring diagram/findings for description). There is moderate left ventricular hypertrophy. Left ventricular diastolic parameters are consistent with Grade II diastolic dysfunction (pseudonormalization).  2. Right ventricular systolic function is normal. The right ventricular size is normal. There is mildly elevated pulmonary artery systolic pressure. The estimated right ventricular systolic pressure is 36.4 mmHg.  3. The mitral valve is grossly normal. Mild mitral valve regurgitation.  4. The aortic valve is tricuspid. There is moderate calcification of the aortic valve. Aortic valve regurgitation is not visualized. Mild to moderate aortic valve sclerosis/calcification is present, without any evidence of aortic stenosis.  5. The inferior vena cava is normal in size with greater than 50% respiratory variability, suggesting right atrial pressure of 3 mmHg.  FINDINGS  Left Ventricle: Left ventricular ejection fraction, by estimation, is 45 to 50%. The left ventricle has mildly decreased function. The left ventricle demonstrates regional wall motion abnormalities. The left ventricular internal cavity size was normal in size. There is moderate left ventricular hypertrophy. Left ventricular diastolic parameters are consistent with Grade II diastolic dysfunction (pseudonormalization).  LV Wall Scoring: The antero-lateral wall, inferior wall, and posterior wall are hypokinetic. The entire anterior wall, entire septum, and entire apex are normal. Right Ventricle: The right ventricular size is normal. No increase in right ventricular wall thickness. Right ventricular systolic function is normal. There is mildly elevated pulmonary artery systolic pressure. The tricuspid regurgitant velocity is 2.89  m/s, and with an assumed right atrial pressure of 3 mmHg, the estimated right ventricular systolic pressure is 36.4 mmHg. Left Atrium: Left atrial size was normal in size. Right Atrium: Right atrial size was normal in size. Pericardium: There is no evidence of pericardial effusion. Mitral Valve: The mitral valve is grossly normal. Mild mitral annular calcification. Mild mitral valve regurgitation. Tricuspid Valve: The tricuspid valve is grossly normal. Tricuspid valve regurgitation is trivial. Aortic Valve: The aortic valve is tricuspid. There is moderate calcification of the aortic valve. There is mild aortic valve annular calcification. Aortic valve regurgitation is not visualized. Mild to moderate aortic valve sclerosis/calcification is present, without any evidence of aortic stenosis. Pulmonic Valve: The pulmonic valve was grossly normal. Pulmonic valve regurgitation is trivial. Aorta: The aortic root is normal in size and structure. Venous: The inferior vena cava  is normal in size with greater than 50% respiratory variability, suggesting right atrial pressure of 3 mmHg.  IAS/Shunts: No atrial level shunt detected by color flow Doppler.  LEFT VENTRICLE PLAX 2D LVIDd:         5.09 cm  Diastology LVIDs:         4.35 cm  LV e' medial:    5.66 cm/s LV PW:         1.74 cm  LV E/e' medial:  14.6 LV IVS:        1.28 cm  LV e' lateral:   6.96 cm/s LVOT diam:     2.00 cm  LV E/e' lateral: 11.9 LVOT Area:     3.14 cm  RIGHT VENTRICLE RV S prime:     10.00 cm/s TAPSE (M-mode): 1.7 cm LEFT ATRIUM             Index       RIGHT ATRIUM           Index LA diam:        4.40 cm 2.14 cm/m  RA Area:     13.80 cm LA Vol (A2C):   69.4 ml 33.79 ml/m RA Volume:   35.30 ml  17.19 ml/m LA Vol (A4C):   34.2 ml 16.65 ml/m LA Biplane Vol: 52.5 ml 25.56 ml/m   AORTA Ao Root diam: 2.90 cm MITRAL VALVE               TRICUSPID VALVE MV Area (PHT): 2.71 cm    TR Peak grad:   33.4 mmHg MV Decel Time: 280 msec    TR Vmax:        289.00 cm/s MR Peak grad: 112.4 mmHg MR Mean grad: 74.0 mmHg    SHUNTS MR Vmax:      530.00 cm/s  Systemic Diam: 2.00 cm MR Vmean:     409.0 cm/s MV E velocity: 82.70 cm/s MV A velocity: 46.30 cm/s MV E/A ratio:  1.79 Nona DellSamuel Mcdowell MD Electronically signed by Nona DellSamuel Mcdowell MD Signature Date/Time: 03/03/2021/4:58:46 PM    Final     Cardiac Studies  LHC 03/03/2021  1. Moderate to severe left main stenosis 2. Mild LAD disease. Patent LIMA to LAD 3. Moderate to severe ostial Circumflex disease with chronic occlusion of the intermediate branch, chronic occlusion of the distal Circumflex and second OM branch. The SVG to the intermediate is chronically occluded. The SVG sequential to the OM and distal Circumflex is patent but there is a severe mid body stenosis in the graft.  4. Dominant RCA with severe mid stenosis. Patent SVG to distal RCA. There is a severe stenosis in the proximal body of the SVG to rCA 5. Successful PTCA/DES x1  Mid body of SVG to OM/Circumflex with distal protection.   Recommendations: Continue DAPT with ASA and Brilinta for one year. Will plan staged PCI  of the SVG to RCA on Monday. I did not perform this today as he had no-reflow phenomenon post PCI of the SVT to OM/Circ.   TTE 03/03/2021 1. Left ventricular ejection fraction, by estimation, is 45 to 50%. The  left ventricle has mildly decreased function. The left ventricle  demonstrates regional wall motion abnormalities (see scoring  diagram/findings for description). There is moderate  left ventricular hypertrophy. Left ventricular diastolic parameters are  consistent with Grade II diastolic dysfunction (pseudonormalization).  2. Right ventricular systolic function is normal. The right ventricular  size is normal. There is mildly elevated pulmonary artery systolic  pressure. The estimated right ventricular systolic pressure is 36.4 mmHg.  3. The mitral valve is grossly normal. Mild mitral valve regurgitation.  4. The aortic valve is tricuspid. There is moderate calcification of the  aortic valve. Aortic valve regurgitation is not visualized. Mild to  moderate aortic valve sclerosis/calcification is present, without any  evidence of aortic stenosis.  5. The inferior vena cava is normal in size with greater than 50%  respiratory variability, suggesting right atrial pressure of 3 mmHg.    Patient Profile  Mark Carpenter is a 65 y.o. male with CAD status post CABG, tobacco abuse, hypertension, hyperlipidemia, COPD who was admitted on 03/03/2021 with non-STEMI.  Status post PCI to the vein graft to an OM branch.  Plan for staged PCI on Monday.  Assessment & Plan   1.  Non-STEMI -Severe stenosis in the vein graft to the OM branch.  Treated with PCI on 03/03/2021.  Staged intervention to vein graft to the RCA tomorrow.  N.p.o. at midnight. -Continue aspirin Brilinta. -He is not diabetic.  LDL is not at goal.  Continue high intensity statin.  Close follow-up as an outpatient. -He just recently quit smoking. -Continue metoprolol succinate 25 mg daily. -Denies chest pain.  Shared  Decision Making/Informed Consent The risks [stroke (1 in 1000), death (1 in 1000), kidney failure [usually temporary] (1 in 500), bleeding (1 in 200), allergic reaction [possibly serious] (1 in 200)], benefits (diagnostic support and management of coronary artery disease) and alternatives of a cardiac catheterization were discussed in detail with Mark Carpenter and he is willing to proceed.  2.  Systolic heart failure, EF 40-45%/ischemic cardiomyopathy -Had a cough yesterday.  Was given 20 mg IV Lasix due to elevated LVEDP on left heart cath.  Cough is improved. -Metoprolol succinate 25 mg daily. -Losartan 25 mg daily added yesterday.  Could consider Entresto.  Cost may be an issue. -We will add Aldactone 12.5 mg daily. -Consider Jardiance at discharge.  3.  Tobacco abuse/COPD -Home inhaler.  Albuterol as needed.  4.  Hypertension -Medications as above.  FEN -No intravenous fluids -Code: Full -DVT PPx: Lovenox -Diet: Salt restricted, n.p.o. midnight.  For questions or updates, please contact [facility] Please consult [email or web address] for contact info under   Time Spent with Patient: I have spent a total of 25 minutes with patient reviewing hospital notes, telemetry, EKGs, labs and examining the patient as well as establishing an assessment and plan that was discussed with the patient.  > 50% of time was spent in direct patient care.    Signed, Lenna Gilford. Flora Lipps, MD, [facility]  [facility]  03/05/2021 8:09 AM

## 2021-03-06 ENCOUNTER — Inpatient Hospital Stay (HOSPITAL_COMMUNITY)
Admission: EM | Disposition: A | Discharge status: Home / Self Care | Payer: Self-pay | Source: Home / Self Care | Attending: Cardiology

## 2021-03-06 ENCOUNTER — Encounter (HOSPITAL_COMMUNITY): Payer: Self-pay | Admitting: Cardiovascular Disease

## 2021-03-06 DIAGNOSIS — I25810 Atherosclerosis of coronary artery bypass graft(s) without angina pectoris: Secondary | ICD-10-CM | POA: Diagnosis not present

## 2021-03-06 DIAGNOSIS — I214 Non-ST elevation (NSTEMI) myocardial infarction: Secondary | ICD-10-CM | POA: Diagnosis not present

## 2021-03-06 HISTORY — PX: CORONARY STENT INTERVENTION: CATH118234

## 2021-03-06 LAB — BASIC METABOLIC PANEL
Anion gap: 10 (ref 5–15)
BUN: 32 mg/dL — ABNORMAL HIGH (ref 8–23)
CO2: 23 mmol/L (ref 22–32)
Calcium: 8.3 mg/dL — ABNORMAL LOW (ref 8.9–10.3)
Chloride: 101 mmol/L (ref 98–111)
Creatinine, Ser: 1.25 mg/dL — ABNORMAL HIGH (ref 0.61–1.24)
GFR, Estimated: >60 mL/min (ref >60)
Glucose, Bld: 107 mg/dL — ABNORMAL HIGH (ref 70–99)
Potassium: 3.7 mmol/L (ref 3.5–5.1)
Sodium: 134 mmol/L — ABNORMAL LOW (ref 135–145)

## 2021-03-06 LAB — CBC
HCT: 32.7 % — ABNORMAL LOW (ref 39.0–52.0)
Hemoglobin: 11.5 g/dL — ABNORMAL LOW (ref 13.0–17.0)
MCH: 31 pg (ref 26.0–34.0)
MCHC: 35.2 g/dL (ref 30.0–36.0)
MCV: 88.1 fL (ref 80.0–100.0)
Platelets: 180 10*3/uL (ref 150–400)
RBC: 3.71 MIL/uL — ABNORMAL LOW (ref 4.22–5.81)
RDW: 12.4 % (ref 11.5–15.5)
WBC: 10.9 10*3/uL — ABNORMAL HIGH (ref 4.0–10.5)
nRBC: 0 % (ref 0.0–0.2)

## 2021-03-06 LAB — POCT ACTIVATED CLOTTING TIME: Activated Clotting Time: 392 seconds

## 2021-03-06 SURGERY — CORONARY STENT INTERVENTION
Anesthesia: LOCAL

## 2021-03-06 MED ORDER — LABETALOL HCL 5 MG/ML IV SOLN: 10.0000 mg | INTRAVENOUS | Status: AC | PRN

## 2021-03-06 MED ORDER — LIDOCAINE HCL (PF) 1 % IJ SOLN
INTRAMUSCULAR | Status: DC | PRN
  Administered 2021-03-06: 20 mL

## 2021-03-06 MED ORDER — MIDAZOLAM HCL 2 MG/2ML IJ SOLN
INTRAMUSCULAR | Status: DC | PRN
  Administered 2021-03-06: 1 mg via INTRAVENOUS

## 2021-03-06 MED ORDER — MORPHINE SULFATE (PF) 2 MG/ML IV SOLN
2.0000 mg | INTRAVENOUS | Status: DC | PRN
Start: 2021-03-06 — End: 2021-03-07
  Administered 2021-03-06: 2 mg via INTRAVENOUS
  Filled 2021-03-06: qty 1

## 2021-03-06 MED ORDER — FENTANYL CITRATE (PF) 100 MCG/2ML IJ SOLN
INTRAMUSCULAR | Status: DC | PRN
  Administered 2021-03-06: 25 ug via INTRAVENOUS

## 2021-03-06 MED ORDER — IOHEXOL 350 MG/ML SOLN
INTRAVENOUS | Status: AC
  Filled 2021-03-06: qty 1

## 2021-03-06 MED ORDER — IOHEXOL 350 MG/ML SOLN
INTRAVENOUS | Status: DC | PRN
  Administered 2021-03-06: 40 mL

## 2021-03-06 MED ORDER — SODIUM CHLORIDE 0.9 % IV SOLN: INTRAVENOUS | Status: DC

## 2021-03-06 MED ORDER — SODIUM CHLORIDE 0.9 % IV SOLN: 250.0000 mL | INTRAVENOUS | Status: DC | PRN

## 2021-03-06 MED ORDER — FENTANYL CITRATE (PF) 100 MCG/2ML IJ SOLN
INTRAMUSCULAR | Status: AC
  Filled 2021-03-06: qty 2

## 2021-03-06 MED ORDER — NITROGLYCERIN 1 MG/10 ML FOR IR/CATH LAB
INTRA_ARTERIAL | Status: AC
  Filled 2021-03-06: qty 10

## 2021-03-06 MED ORDER — SODIUM CHLORIDE 0.9% FLUSH: 3.0000 mL | Freq: Two times a day (BID) | INTRAVENOUS | Status: DC

## 2021-03-06 MED ORDER — SODIUM CHLORIDE 0.9% FLUSH: 3.0000 mL | INTRAVENOUS | Status: DC | PRN

## 2021-03-06 MED ORDER — HEPARIN (PORCINE) IN NACL 1000-0.9 UT/500ML-% IV SOLN
INTRAVENOUS | Status: AC
  Filled 2021-03-06: qty 1500

## 2021-03-06 MED ORDER — HEPARIN SODIUM (PORCINE) 1000 UNIT/ML IJ SOLN
INTRAMUSCULAR | Status: AC
  Filled 2021-03-06: qty 1

## 2021-03-06 MED ORDER — SODIUM CHLORIDE 0.9 % IV SOLN: INTRAVENOUS | Status: AC

## 2021-03-06 MED ORDER — HEPARIN (PORCINE) IN NACL 1000-0.9 UT/500ML-% IV SOLN
INTRAVENOUS | Status: DC | PRN
  Administered 2021-03-06: 500 mL

## 2021-03-06 MED ORDER — LIDOCAINE HCL (PF) 1 % IJ SOLN
INTRAMUSCULAR | Status: AC
  Filled 2021-03-06: qty 30

## 2021-03-06 MED ORDER — MIDAZOLAM HCL 2 MG/2ML IJ SOLN
INTRAMUSCULAR | Status: AC
  Filled 2021-03-06: qty 2

## 2021-03-06 MED ORDER — HYDRALAZINE HCL 20 MG/ML IJ SOLN: 10.0000 mg | INTRAMUSCULAR | Status: AC | PRN

## 2021-03-06 MED ORDER — HEPARIN SODIUM (PORCINE) 1000 UNIT/ML IJ SOLN
INTRAMUSCULAR | Status: DC | PRN
  Administered 2021-03-06: 9000 [IU] via INTRAVENOUS

## 2021-03-06 MED ORDER — VERAPAMIL HCL 2.5 MG/ML IV SOLN
INTRAVENOUS | Status: AC
  Filled 2021-03-06: qty 2

## 2021-03-06 SURGICAL SUPPLY — 14 items
BALLN SAPPHIRE 2.0X12 (BALLOONS) ×2
BALLOON SAPPHIRE 2.0X12 (BALLOONS) ×1 IMPLANT
CATHETER LAUNCHER 6FR RCB (CATHETERS) ×2 IMPLANT
KIT ENCORE 26 ADVANTAGE (KITS) ×2 IMPLANT
KIT HEART LEFT (KITS) ×2 IMPLANT
PACK CARDIAC CATHETERIZATION (CUSTOM PROCEDURE TRAY) ×2 IMPLANT
SHEATH PINNACLE 6F 10CM (SHEATH) ×2 IMPLANT
SHEATH PROBE COVER 6X72 (BAG) ×2 IMPLANT
STENT SYNERGY XD 2.50X16 (Permanent Stent) ×1 IMPLANT
SYNERGY XD 2.50X16 (Permanent Stent) ×2 IMPLANT
TRANSDUCER W/STOPCOCK (MISCELLANEOUS) ×2 IMPLANT
TUBING CIL FLEX 10 FLL-RA (TUBING) ×2 IMPLANT
WIRE ASAHI PROWATER 180CM (WIRE) ×2 IMPLANT
WIRE EMERALD 3MM-J .035X150CM (WIRE) ×2 IMPLANT

## 2021-03-06 NOTE — Progress Notes (Signed)
   Patient was seen in the Cath Lab with Dr. Allyson Sabal.  Seen and evaluated today following cath.  Apparently his HD took a while to get back to normal for sheath removal.  Did have some mild hematoma.  Cath site looks stable now.  No further chest pain.  No longer on IV heparin. Stable home medications.  Dissipate discharge in the morning if stable.  Cardiac rehab to see in the morning.   Bryan Lemma, MD

## 2021-03-06 NOTE — TOC Benefit Eligibility Note (Signed)
Patient Product/process development scientist completed.    The patient is currently admitted and upon discharge could be taking Brilinta 90 mg, Entresto 24mg /26mg  and Jardiance 10 mg.  They all require a Formulary Exclusion Authorization.  The patient is insured through  I have filled out the Formulary Exclusion Authorization Form and Faxed it in.    [employer], CPhT Pharmacy Patient Advocate Specialist [place] [facility] Stewardship Team Direct Number: [phone]  Fax: [phone]

## 2021-03-06 NOTE — TOC Initial Note (Signed)
Transition of Care [facility]) - Initial/Assessment Note    Patient Details  Name: Mark Carpenter MRN: [id number] Date of Birth: 06-25-1956  [facility]) CM/SW Contact:    Kermit Balo, RN Phone Number: 03/06/2021, 5:07 PM  Clinical Narrative:                 Patient is from home alone. He states his wife passed a few years ago and he has basically given up. He quit taking his meds, etc.  Pt states he will have medicare starting next month. No current insurance or PCP. Pt would like assistance in obtaining a PCP.  Pt denies transportation issues as he normally drives himself.  TOC following and will arrange PCP and follow for d/c meds.   Expected Discharge Plan: Home/Self Care Barriers to Discharge: Inadequate or no insurance,Continued Medical Work up   Patient Goals and CMS Choice        Expected Discharge Plan and Services Expected Discharge Plan: Home/Self Care   Discharge Planning Services: CM Consult   Living arrangements for the past 2 months: Single Family Home                                      Prior Living Arrangements/Services Living arrangements for the past 2 months: Single Family Home Lives with:: Self Patient language and need for interpreter reviewed:: Yes Do you feel safe going back to the place where you live?: Yes        Care giver support system in place?: No (comment) Current home services: DME (cane/ walker/ wheelchair/ shower seat) Criminal Activity/Legal Involvement Pertinent to Current Situation/Hospitalization: No - Comment as needed  Activities of Daily Living Home Assistive Devices/Equipment: None ADL Screening (condition at time of admission) Patient's cognitive ability adequate to safely complete daily activities?: No Is the patient deaf or have difficulty hearing?: No Does the patient have difficulty seeing, even when wearing glasses/contacts?: No Does the patient have difficulty concentrating, remembering, or making  decisions?: No Patient able to express need for assistance with ADLs?: No Does the patient have difficulty dressing or bathing?: No Independently performs ADLs?: Yes (appropriate for developmental age) Does the patient have difficulty walking or climbing stairs?: No Weakness of Legs: None Weakness of Arms/Hands: None  Permission Sought/Granted                  Emotional Assessment Appearance:: Appears stated age Attitude/Demeanor/Rapport: Engaged Affect (typically observed): Accepting Orientation: : Oriented to Self,Oriented to Place,Oriented to  Time,Oriented to Situation   Psych Involvement: No (comment)  Admission diagnosis:  Atypical chest pain [R07.89] NSTEMI (non-ST elevated myocardial infarction) [facility]) [I21.4] Patient Active Problem List   Diagnosis Date Noted  . Acute systolic heart failure (HCC) 03/04/2021  . COPD (chronic obstructive pulmonary disease) (HCC) 03/04/2021  . NSTEMI (non-ST elevated myocardial infarction) (HCC) 03/03/2021  . S/P CABG x 5 05/10/2013  . Volume excess 05/05/2013  . GERD (gastroesophageal reflux disease) 04/29/2013  . HLD (hyperlipidemia) 04/29/2013  . Carotid artery bruit 04/29/2013  . HTN (hypertension) 04/29/2013  . Unstable angina (HCC) 04/28/2013  . Cigarette smoker 04/28/2013   PCP:  Patient, No Pcp Per Pharmacy:   [facility] [address], [place] - [address] [place] [address] Phone: [phone] Fax: [phone]  [facility] [place], [place] - [address]  [place] [address] Phone: [phone] Fax: [phone]     Social Determinants of Health (SDOH) Interventions    Readmission Risk Interventions No flowsheet data found.

## 2021-03-06 NOTE — Interval H&P Note (Signed)
Cath Lab Visit (complete for each Cath Lab visit)  Clinical Evaluation Leading to the Procedure:   ACS: Yes.    Non-ACS:    Anginal Classification: CCS III  Anti-ischemic medical therapy: Minimal Therapy (1 class of medications)  Non-Invasive Test Results: No non-invasive testing performed  Prior CABG: Previous CABG      History and Physical Interval Note:  03/06/2021 10:53 AM  Mark Carpenter  has presented today for surgery, with the diagnosis of chest pain.  The various methods of treatment have been discussed with the patient and family. After consideration of risks, benefits and other options for treatment, the patient has consented to  Procedure(s): CORONARY STENT INTERVENTION (N/A) as a surgical intervention.  The patient's history has been reviewed, patient examined, no change in status, stable for surgery.  I have reviewed the patient's chart and labs.  Questions were answered to the patient's satisfaction.     Mark Carpenter

## 2021-03-06 NOTE — Progress Notes (Signed)
Site area: right groin  Site Prior to Removal:  Level 0  Pressure Applied For 20 MINUTES    Minutes Beginning at 1650  Manual:   Yes.    Patient Status During Pull:  stable  Post Pull Groin Site:  Level 0  Post Pull Instructions Given:  Yes.    Post Pull Pulses Present:  Yes.    Dressing Applied:  Yes.    Comments:   

## 2021-03-07 ENCOUNTER — Other Ambulatory Visit: Payer: Self-pay | Admitting: Cardiology

## 2021-03-07 DIAGNOSIS — I214 Non-ST elevation (NSTEMI) myocardial infarction: Secondary | ICD-10-CM | POA: Diagnosis not present

## 2021-03-07 DIAGNOSIS — E782 Mixed hyperlipidemia: Secondary | ICD-10-CM

## 2021-03-07 DIAGNOSIS — Z9861 Coronary angioplasty status: Secondary | ICD-10-CM

## 2021-03-07 DIAGNOSIS — I5043 Acute on chronic combined systolic (congestive) and diastolic (congestive) heart failure: Secondary | ICD-10-CM | POA: Diagnosis not present

## 2021-03-07 DIAGNOSIS — I10 Essential (primary) hypertension: Secondary | ICD-10-CM

## 2021-03-07 DIAGNOSIS — I25700 Atherosclerosis of coronary artery bypass graft(s), unspecified, with unstable angina pectoris: Secondary | ICD-10-CM

## 2021-03-07 LAB — BASIC METABOLIC PANEL WITH GFR
Anion gap: 8 (ref 5–15)
BUN: 28 mg/dL — ABNORMAL HIGH (ref 8–23)
CO2: 23 mmol/L (ref 22–32)
Calcium: 8.2 mg/dL — ABNORMAL LOW (ref 8.9–10.3)
Chloride: 103 mmol/L (ref 98–111)
Creatinine, Ser: 1.01 mg/dL (ref 0.61–1.24)
GFR, Estimated: >60 mL/min
Glucose, Bld: 110 mg/dL — ABNORMAL HIGH (ref 70–99)
Potassium: 3.8 mmol/L (ref 3.5–5.1)
Sodium: 134 mmol/L — ABNORMAL LOW (ref 135–145)

## 2021-03-07 LAB — CBC
HCT: 33.4 % — ABNORMAL LOW (ref 39.0–52.0)
Hemoglobin: 11 g/dL — ABNORMAL LOW (ref 13.0–17.0)
MCH: 29.8 pg (ref 26.0–34.0)
MCHC: 32.9 g/dL (ref 30.0–36.0)
MCV: 90.5 fL (ref 80.0–100.0)
Platelets: 188 10*3/uL (ref 150–400)
RBC: 3.69 MIL/uL — ABNORMAL LOW (ref 4.22–5.81)
RDW: 12.4 % (ref 11.5–15.5)
WBC: 10.9 10*3/uL — ABNORMAL HIGH (ref 4.0–10.5)
nRBC: 0 % (ref 0.0–0.2)

## 2021-03-07 MED ORDER — METOPROLOL SUCCINATE ER 25 MG PO TB24: 25.0000 mg | ORAL_TABLET | Freq: Every day | ORAL | 11 refills | Status: AC

## 2021-03-07 MED ORDER — ATORVASTATIN CALCIUM 80 MG PO TABS: 80.0000 mg | ORAL_TABLET | Freq: Every day | ORAL | 3 refills | Status: AC

## 2021-03-07 MED ORDER — ASPIRIN 81 MG PO TBEC: 81.0000 mg | DELAYED_RELEASE_TABLET | Freq: Every day | ORAL | 3 refills | Status: AC

## 2021-03-07 MED ORDER — NITROGLYCERIN 0.4 MG SL SUBL: 0.4000 mg | SUBLINGUAL_TABLET | SUBLINGUAL | 1 refills | Status: AC | PRN

## 2021-03-07 MED ORDER — SPIRONOLACTONE 25 MG PO TABS: 12.5000 mg | ORAL_TABLET | Freq: Every day | ORAL | 11 refills | Status: AC

## 2021-03-07 MED ORDER — TICAGRELOR 90 MG PO TABS: 90.0000 mg | ORAL_TABLET | Freq: Two times a day (BID) | ORAL | 12 refills | Status: AC

## 2021-03-07 MED ORDER — MOMETASONE FURO-FORMOTEROL FUM 200-5 MCG/ACT IN AERO: 2.0000 | INHALATION_SPRAY | Freq: Two times a day (BID) | RESPIRATORY_TRACT | 12 refills | Status: AC

## 2021-03-07 MED ORDER — ALBUTEROL SULFATE HFA 108 (90 BASE) MCG/ACT IN AERS: 2.0000 | INHALATION_SPRAY | Freq: Four times a day (QID) | RESPIRATORY_TRACT | 2 refills | Status: AC | PRN

## 2021-03-07 MED ORDER — BUDESONIDE-FORMOTEROL FUMARATE 160-4.5 MCG/ACT IN AERO: 2.0000 | INHALATION_SPRAY | Freq: Two times a day (BID) | RESPIRATORY_TRACT | 12 refills | Status: DC

## 2021-03-07 MED ORDER — BUDESONIDE-FORMOTEROL FUMARATE 160-4.5 MCG/ACT IN AERO: 2.0000 | INHALATION_SPRAY | Freq: Two times a day (BID) | RESPIRATORY_TRACT | 12 refills | Status: AC

## 2021-03-07 MED ORDER — LOSARTAN POTASSIUM 25 MG PO TABS: 12.5000 mg | ORAL_TABLET | Freq: Every day | ORAL | 11 refills | Status: AC

## 2021-03-07 MED ORDER — LOSARTAN POTASSIUM 25 MG PO TABS: 12.5000 mg | ORAL_TABLET | Freq: Every day | ORAL | 11 refills | Status: DC

## 2021-03-07 MED FILL — Nitroglycerin IV Soln 100 MCG/ML in D5W: INTRA_ARTERIAL | Qty: 10 | Status: AC

## 2021-03-07 NOTE — Plan of Care (Signed)
  Problem: Cardiovascular: Goal: Vascular access site(s) Level 0-1 will be maintained Outcome: Progressing   Problem: Education: Goal: Knowledge of General Education information will improve Description: Including pain rating scale, medication(s)/side effects and non-pharmacologic comfort measures Outcome: Progressing   Problem: Clinical Measurements: Goal: Ability to maintain clinical measurements within normal limits will improve Outcome: Progressing Goal: Will remain free from infection Outcome: Progressing Goal: Diagnostic test results will improve Outcome: Progressing Goal: Respiratory complications will improve Outcome: Progressing Goal: Cardiovascular complication will be avoided Outcome: Progressing

## 2021-03-07 NOTE — Discharge Instructions (Signed)
WEIGH DAILY, every am, wearing the same amount of clothing Record weights, and bring it to office appointments Contact Nona Dell, MD for weight gain of 3 lbs in a day or 5 lbs in a week Limit sodium to 500 mg per meal, total 2000 mg per day Limit all liquids to 1.5-2 liters/quarts per day   PLEASE REMEMBER TO BRING ALL OF YOUR MEDICATIONS TO EACH OF YOUR FOLLOW-UP OFFICE VISITS.  PLEASE ATTEND ALL SCHEDULED FOLLOW-UP APPOINTMENTS.   Activity: Increase activity slowly as tolerated. You may shower, but no soaking baths (or swimming) for 1 week. No driving for 1 week. No lifting over 5 lbs for 2 weeks. No sexual activity for 1 week.   You May Return to Work: in 3 weeks (if applicable)  Wound Care: You may wash cath site gently with soap and water. Keep cath site clean and dry. If you notice pain, swelling, bleeding or pus at your cath site, please call [phone].    Cardiac Cath Site Care Refer to this sheet in the next few weeks. These instructions provide you with information on caring for yourself after your procedure. Your caregiver may also give you more specific instructions. Your treatment has been planned according to current medical practices, but problems sometimes occur. Call your caregiver if you have any problems or questions after your procedure. HOME CARE INSTRUCTIONS  You may shower 24 hours after the procedure. Remove the bandage (dressing) and gently wash the site with plain soap and water. Gently pat the site dry.   Do not apply powder or lotion to the site.   Do not sit in a bathtub, swimming pool, or whirlpool for 5 to 7 days.   No bending, squatting, or lifting anything over 10 pounds (4.5 kg) as directed by your caregiver.   Inspect the site at least twice daily.   Do not drive home if you are discharged the same day of the procedure. Have someone else drive you.   You may drive 24 hours after the procedure unless otherwise instructed by your caregiver.   What to expect:  Any bruising will usually fade within 1 to 2 weeks.   Blood that collects in the tissue (hematoma) may be painful to the touch. It should usually decrease in size and tenderness within 1 to 2 weeks.  SEEK IMMEDIATE MEDICAL CARE IF:  You have unusual pain at the site or down the affected limb.   You have redness, warmth, swelling, or pain at the site.   You have drainage (other than a small amount of blood on the dressing).   You have chills.   You have a fever or persistent symptoms for more than 72 hours.   You have a fever and your symptoms suddenly get worse.   Your leg becomes pale, cool, tingly, or numb.   You have heavy bleeding from the site. Hold pressure on the site.  Document Released: 01/12/2011 Document Revised: 11/29/2011 Document Reviewed:

## 2021-03-07 NOTE — Care Management (Signed)
03-07-21 I nsurance will not cover Brilinta at all. First 30 day free supply provided to patient by the [facility]. Case Manager sent a message to the PA to make them aware that the patient may need to be switched to Plavix for the next month. No further needs from Case Manager at this time. Gala Lewandowsky, RN,BSN Case Manager

## 2021-03-07 NOTE — Progress Notes (Signed)
CARDIAC REHAB PHASE I   PRE:  Rate/Rhythm: 87 SR  BP:  Supine:   Sitting:   Standing: 143/70   SaO2: 98%RA  MODE:  Ambulation: 470 ft   POST:  Rate/Rhythm: 94 SR  BP:  Supine:   Sitting: 143/63  Standing:    SaO2: 98%RA [id number] Pt walked 470 ft on RA with steady gait and tolerated well. No CP. Reviewed importance of brilinta and not to run out. Encouraged smoking cessation. Pt stated he has quit. Knows he can call [email or web address] if needed. No questions regarding ed done on Saturday.  Has referral to [facility].   Luetta Nutting, RN BSN  03/07/2021 9:54 AM

## 2021-03-07 NOTE — Discharge Summary (Addendum)
Discharge Summary    Patient ID: Mark Carpenter MRN: [id number]; DOB: 1956/04/15  Admit date: 03/03/2021 Discharge date: 03/07/2021  PCP:  Patient, No Pcp Per   [facility]  Cardiologist:  Rozann Lesches, MD  Advanced Practice Provider:  No care team member to display Electrophysiologist:  None    Discharge Diagnoses    Principal Problem:   NSTEMI (non-ST elevated myocardial infarction) [facility]) Active Problems:   HLD (hyperlipidemia)   HTN (hypertension)   Acute systolic heart failure ([place])   COPD (chronic obstructive pulmonary disease) ([place])    Diagnostic Studies/Procedures    Cath: 03/03/21   Prox RCA lesion is 90% stenosed.  Mid RCA lesion is 99% stenosed.  SVG graft was visualized by angiography.  Origin to Prox Graft lesion is 95% stenosed.  Ramus lesion is 100% stenosed.  SVG graft was visualized by angiography.  Origin to Prox Graft lesion is 100% stenosed.  LPAV lesion is 100% stenosed.  1st Mrg lesion is 100% stenosed.  Mid LM to Dist LM lesion is 60% stenosed.  Ost Cx to Mid Cx lesion is 60% stenosed.  LIMA graft was visualized by angiography.  Mid Graft lesion before 1st Mrg is 99% stenosed.  A drug-eluting stent was successfully placed using a SYNERGY XD 4.0X20.  Post intervention, there is a 0% residual stenosis.   1. Moderate to severe left main stenosis 2. Mild LAD disease. Patent LIMA to LAD 3. Moderate to severe ostial Circumflex disease with chronic occlusion of the intermediate branch, chronic occlusion of the distal Circumflex and second OM branch. The SVG to the intermediate is chronically occluded. The SVG sequential to the OM and distal Circumflex is patent but there is a severe mid body stenosis in the graft.  4. Dominant RCA with severe mid stenosis. Patent SVG to distal RCA. There is a severe stenosis in the proximal body of the SVG to rCA 5. Successful PTCA/DES x1  Mid body of SVG to OM/Circumflex with  distal protection.   Recommendations: Continue DAPT with ASA and Brilinta for one year. Will plan staged PCI of the SVG to RCA on Monday. I did not perform this today as he had no-reflow phenomenon post PCI of the SVT to OM/Circ.   Diagnostic Dominance: Co-dominant    Intervention    Cath: 03/06/21  IMPRESSION: Successful PCI drug-eluting stenting of the origin of the vein graft to the PDA using a 2.5 x 16 mm long Synergy drug-eluting stent reducing a 99% lesion to 0% residual.  There is excellent flow at the end of the case.  The sheath will be removed once the ACT falls below 170 and pressure held.  Patient will need dual antiplatelet therapy uninterrupted for 12 months.  He will be hydrated overnight given his serum creatinine of 1.25 and his Aldactone and losartan will be held.  Quay Burow. MD, [facility] 03/06/2021 11:42 AM  Diagnostic Dominance: Right    Intervention    Echo: 03/03/21  IMPRESSIONS    1. Left ventricular ejection fraction, by estimation, is 45 to 50%. The  left ventricle has mildly decreased function. The left ventricle  demonstrates regional wall motion abnormalities (see scoring  diagram/findings for description). There is moderate  left ventricular hypertrophy. Left ventricular diastolic parameters are  consistent with Grade II diastolic dysfunction (pseudonormalization).  2. Right ventricular systolic function is normal. The right ventricular  size is normal. There is mildly elevated pulmonary artery systolic  pressure. The estimated right ventricular systolic pressure  is 36.4 mmHg.  3. The mitral valve is grossly normal. Mild mitral valve regurgitation.  4. The aortic valve is tricuspid. There is moderate calcification of the  aortic valve. Aortic valve regurgitation is not visualized. Mild to  moderate aortic valve sclerosis/calcification is present, without any  evidence of aortic stenosis.  5. The inferior vena cava is normal in size  with greater than 50%  respiratory variability, suggesting right atrial pressure of 3 mmHg.   _____________   History of Present Illness     Mark Carpenter is a 65 y.o. male with a history of left main/multivessel CAD status post CABG in 2014, tobacco abuse, hypertension, hyperlipidemia, and COPD. [name] presented to the [facility] ER complaining of recurrent chest pressure over the last 3 days, fairly intense, associated with shortness of breath and also radiation to the shoulder blades.  He is status post CABG in 2014.  Postoperative course was complicated by subcutaneous emphysema and recurrent pneumothoraces in the setting of COPD, did require chest tube.  He has had no cardiology follow-up since 2014.  States that his wife passed away a year and a half ago, he became depressed thereafter and stopped all of his medications.  He currently works for a [employer].  Has a longstanding history of tobacco abuse, states that he stopped smoking 3 days ago when his symptoms started.  He reports no active chest pain on my examination in the ER.  ECG showed prominent anterolateral ST segment depression.  Initial high-sensitivity troponin I is 6425.  Hospital Course      1. NSTEMI: hsTn peaked at 6425, underwent cardiac cath on 3/11 with Dr. Angelena Form successful PTCA/DESx1 to mid body of SVG-OM/Lcx with distal protection. Placed on DAPT with ASA/Brilinta for at least one year. Then underwent staged intervention with PCI/DES to origin of SVG- PDA.  -- continue on DAPT with ASA/Brilinta, atorvastatin 20m daily, losartan 263mdaily. - may not be able to get insurance coverage of Brilinta, if so, change to Plavix at f/u appt.  2. Systolic CHF/ICM: Echo with EF of 45-45%. Did briefly require IV lasix with elevated LVEDP post cath 3/11.  -- treated with metoprolol succinate 2568maily, losartan 54m70mily (start 03/16), aldactone 12.5mg 81mly   3. HTN: stable at the time of discharge with metoprolol  succinate 54mg 80my, losartan 54mg d39m and aldactone 12.5 mg qd. Check BMET at f/u appt.  4. HLD: LDL 101 -- started on atorvastatin 80mg da49m-- will need FLP/LFTs in 8 weeks   5. Tobacco use/COPD: cessation advised, pt says will never smoke again.  General: Well developed, well nourished, male appearing in no acute distress. Head: Normocephalic, atraumatic.  Neck: Supple without bruits, JVD not elevated. Lungs:  Resp regular and unlabored, CTA. Heart: RRR, S1, S2, no S3, S4, or murmur; no rub. Abdomen: Soft, non-tender, non-distended with normoactive bowel sounds. No hepatomegaly. No rebound/guarding. No obvious abdominal masses. Extremities: No clubbing, cyanosis, no edema. Distal pedal pulses are 2+ bilaterally. L radial cath site stable without hematoma, mild ecchymosis. R groin cath site with ecchymosis, no hematoma Neuro: Alert and oriented X 3. Moves all extremities spontaneously. Psych: Normal affect.  Did the patient have an acute coronary syndrome (MI, NSTEMI, STEMI, etc) this admission?:  Yes                               AHA/ACC Clinical Performance & Quality Measures: 1. Aspirin prescribed? -  Yes 2. ADP Receptor Inhibitor (Plavix/Clopidogrel, Brilinta/Ticagrelor or Effient/Prasugrel) prescribed (includes medically managed patients)? - Yes 3. Beta Blocker prescribed? - Yes 4. High Intensity Statin (Lipitor 40-55m or Crestor 20-473m prescribed? - Yes 5. EF assessed during THIS hospitalization? - Yes 6. For EF <40%, was ACEI/ARB prescribed? - Yes 7. For EF <40%, Aldosterone Antagonist (Spironolactone or Eplerenone) prescribed? - Yes 8. Cardiac Rehab Phase II ordered (including medically managed patients)? - Yes   _____________  Discharge Vitals Blood pressure 137/68, pulse 62, temperature 98.4 F (36.9 C), temperature source Oral, resp. rate 18, height _0  (1.803 m), weight 83.2 kg, SpO2 97 %.  Filed Weights   03/04/21 0632 03/06/21 0537 03/07/21 0402  Weight:  83.5 kg 85 kg 83.2 kg    Labs & Radiologic Studies    CBC Recent Labs    03/06/21 0230 03/07/21 0242  WBC 10.9* 10.9*  HGB 11.5* 11.0*  HCT 32.7* 33.4*  MCV 88.1 90.5  PLT 180 18546 Basic Metabolic Panel Recent Labs    03/06/21 0230 03/07/21 0242  NA 134* 134*  K 3.7 3.8  CL 101 103  CO2 23 23  GLUCOSE 107* 110*  BUN 32* 28*  CREATININE 1.25* 1.01  CALCIUM 8.3* 8.2*   Liver Function Tests Lab Results  Component Value Date   ALT 29 03/03/2021   AST 35 03/03/2021   ALKPHOS 98 03/03/2021   BILITOT 0.6 03/03/2021    High Sensitivity Troponin:   Recent Labs  Lab 03/03/21 1003 03/03/21 1200  TROPONINIHS 6,425* 6,404*    Hemoglobin A1C Lab Results  Component Value Date   HGBA1C 5.7 (H) 03/03/2021   Fasting Lipid Panel Lab Results  Component Value Date   CHOL 154 03/04/2021   HDL 24 (L) 03/04/2021   LDLCALC 101 (H) 03/04/2021   TRIG 143 03/04/2021   CHOLHDL 6.4 03/04/2021    _____________  CARDIAC CATHETERIZATION  Result Date: 03/06/2021  Origin lesion is 99% stenosed.  A drug-eluting stent was successfully placed using a SYNERGY XD 2.50X16.  Post intervention, there is a 0% residual stenosis.  Mark Carpenter a [age].o. male  [id number]:  [facility]: JoQuay BurowM.D. [birth year]ATE OF PROCEDURE:  03/06/2021 DATE OF DISCHARGE: CARDIAC CATHETERIZATION / PCI DES SVG PDA History obtained from chart review.StMurl Golladayryoris a [age].o.malewith CAD status post CABG, tobacco abuse, hypertension, hyperlipidemia, COPD who was admitted on 03/03/2021 with non-STEMI. Status post PCI to the vein graft to an OM branch. Plan for staged PCI on Monday. PROCEDURE DESCRIPTION: The patient was brought to the second floor [place] Cardiac cath lab in the postabsorptive state. He was premedicated with IV Versed and fentanyl. His right groinwas prepped and shaved in usual sterile fashion. Xylocaine 1% was used for local anesthesia. A 6 French sheath was  inserted into the right common femoral  artery using standard Seldinger technique.  Ultrasound was used to identify the right common femoral artery and guide access.  A digital image of this was captured and placed the patient's chart. The patient received a total of 9000 units of heparin with an ACT of greater than 300.  Using a 6 Fr[place]C B guide catheter along with a 0.14 [place] guidewire and a 2 mm x 12 mm balloon the origin of the vein graft was crossed and predilated.  Isovue dye was used for the entirety of the case (40 cc delivered to patient).  Retrograde aortic pressures monitored during the case.  The patient was already on aspirin Brilinta. I carefully positioned a 2.5 mm x 60 mm long Synergy drug-eluting stent and deployed it at the origin of the vein graft to the PDA at 16 atm (2.7 mm) resulting reduction of a 99% lesion to 0% residual.  There was no evidence of "no reflow".  Patient tolerated procedure well.  The guidewire and catheter removed.  The sheath was secured in place and the patient left lab in stable condition.   Successful PCI drug-eluting stenting of the origin of the vein graft to the PDA using a 2.5 x 16 mm long Synergy drug-eluting stent reducing a 99% lesion to 0% residual.  There is excellent flow at the end of the case.  The sheath will be removed once the ACT falls below 170 and pressure held.  Patient will need dual antiplatelet therapy uninterrupted for 12 months.  He will be hydrated overnight given his serum creatinine of 1.25 and his Aldactone and losartan will be held. Quay Burow. MD, [facility] 03/06/2021 11:42 AM   CARDIAC CATHETERIZATION  Result Date: 03/03/2021  Prox RCA lesion is 90% stenosed.  Mid RCA lesion is 99% stenosed.  SVG graft was visualized by angiography.  Origin to Prox Graft lesion is 95% stenosed.  Ramus lesion is 100% stenosed.  SVG graft was visualized by angiography.  Origin to Prox Graft lesion is 100% stenosed.  LPAV lesion is 100%  stenosed.  1st Mrg lesion is 100% stenosed.  Mid LM to Dist LM lesion is 60% stenosed.  Ost Cx to Mid Cx lesion is 60% stenosed.  LIMA graft was visualized by angiography.  Mid Graft lesion before 1st Mrg is 99% stenosed.  A drug-eluting stent was successfully placed using a SYNERGY XD 4.0X20.  Post intervention, there is a 0% residual stenosis.  1. Moderate to severe left main stenosis 2. Mild LAD disease. Patent LIMA to LAD 3. Moderate to severe ostial Circumflex disease with chronic occlusion of the intermediate branch, chronic occlusion of the distal Circumflex and second OM branch. The SVG to the intermediate is chronically occluded. The SVG sequential to the OM and distal Circumflex is patent but there is a severe mid body stenosis in the graft. 4. Dominant RCA with severe mid stenosis. Patent SVG to distal RCA. There is a severe stenosis in the proximal body of the SVG to rCA 5. Successful PTCA/DES x1  Mid body of SVG to OM/Circumflex with distal protection. Recommendations: Continue DAPT with ASA and Brilinta for one year. Will plan staged PCI of the SVG to RCA on Monday. I did not perform this today as he had no-reflow phenomenon post PCI of the SVT to OM/Circ.   DG Chest Port 1 View  Result Date: 03/03/2021 CLINICAL DATA:  Chest pain for 4 days, cough. EXAM: PORTABLE CHEST 1 VIEW COMPARISON:  06/02/2013. FINDINGS: Trachea is midline. Heart size is accentuated by AP technique. Thoracic aorta is calcified. Lungs are clear. No pleural fluid. IMPRESSION: No acute findings. Electronically Signed   By: Lorin Picket M.D.   On: 03/03/2021 10:29   ECHOCARDIOGRAM COMPLETE  Result Date: 03/03/2021    ECHOCARDIOGRAM REPORT   Patient Name:   DEJOUR VOS Date of Exam: 03/03/2021 Medical Rec #:  [id number]     Height:       71.0 in Accession #:    [id number]    Weight:       188.0 lb Date of Birth:  Feb 04, 1956     BSA:  2.054 m Patient Age:    43 years      BP:           160/87 mmHg Patient  Gender: M             HR:           77 bpm. Exam Location:  [facility] Procedure: 2D Echo Indications:    NSTEMI I21.4  History:        Patient has prior history of Echocardiogram examinations, most                 recent 05/04/2013. Previous Myocardial Infarction; Risk                 Factors:Current Smoker and Hypertension. Carotid Artery Bruit.  Sonographer:    Leavy Cella RDCS (AE) Referring Phys: [id number] [place]  1. Left ventricular ejection fraction, by estimation, is 45 to 50%. The left ventricle has mildly decreased function. The left ventricle demonstrates regional wall motion abnormalities (see scoring diagram/findings for description). There is moderate left ventricular hypertrophy. Left ventricular diastolic parameters are consistent with Grade II diastolic dysfunction (pseudonormalization).  2. Right ventricular systolic function is normal. The right ventricular size is normal. There is mildly elevated pulmonary artery systolic pressure. The estimated right ventricular systolic pressure is 63.8 mmHg.  3. The mitral valve is grossly normal. Mild mitral valve regurgitation.  4. The aortic valve is tricuspid. There is moderate calcification of the aortic valve. Aortic valve regurgitation is not visualized. Mild to moderate aortic valve sclerosis/calcification is present, without any evidence of aortic stenosis.  5. The inferior vena cava is normal in size with greater than 50% respiratory variability, suggesting right atrial pressure of 3 mmHg. FINDINGS  Left Ventricle: Left ventricular ejection fraction, by estimation, is 45 to 50%. The left ventricle has mildly decreased function. The left ventricle demonstrates regional wall motion abnormalities. The left ventricular internal cavity size was normal in size. There is moderate left ventricular hypertrophy. Left ventricular diastolic parameters are consistent with Grade II diastolic dysfunction (pseudonormalization).  LV Wall  Scoring: The antero-lateral wall, inferior wall, and posterior wall are hypokinetic. The entire anterior wall, entire septum, and entire apex are normal. Right Ventricle: The right ventricular size is normal. No increase in right ventricular wall thickness. Right ventricular systolic function is normal. There is mildly elevated pulmonary artery systolic pressure. The tricuspid regurgitant velocity is 2.89  m/s, and with an assumed right atrial pressure of 3 mmHg, the estimated right ventricular systolic pressure is 93.7 mmHg. Left Atrium: Left atrial size was normal in size. Right Atrium: Right atrial size was normal in size. Pericardium: There is no evidence of pericardial effusion. Mitral Valve: The mitral valve is grossly normal. Mild mitral annular calcification. Mild mitral valve regurgitation. Tricuspid Valve: The tricuspid valve is grossly normal. Tricuspid valve regurgitation is trivial. Aortic Valve: The aortic valve is tricuspid. There is moderate calcification of the aortic valve. There is mild aortic valve annular calcification. Aortic valve regurgitation is not visualized. Mild to moderate aortic valve sclerosis/calcification is present, without any evidence of aortic stenosis. Pulmonic Valve: The pulmonic valve was grossly normal. Pulmonic valve regurgitation is trivial. Aorta: The aortic root is normal in size and structure. Venous: The inferior vena cava is normal in size with greater than 50% respiratory variability, suggesting right atrial pressure of 3 mmHg. IAS/Shunts: No atrial level shunt detected by color flow Doppler.  LEFT VENTRICLE PLAX 2D LVIDd:  5.09 cm  Diastology LVIDs:         4.35 cm  LV e' medial:    5.66 cm/s LV PW:         1.74 cm  LV E/e' medial:  14.6 LV IVS:        1.28 cm  LV e' lateral:   6.96 cm/s LVOT diam:     2.00 cm  LV E/e' lateral: 11.9 LVOT Area:     3.14 cm  RIGHT VENTRICLE RV S prime:     10.00 cm/s TAPSE (M-mode): 1.7 cm LEFT ATRIUM             Index        RIGHT ATRIUM           Index LA diam:        4.40 cm 2.14 cm/m  RA Area:     13.80 cm LA Vol (A2C):   69.4 ml 33.79 ml/m RA Volume:   35.30 ml  17.19 ml/m LA Vol (A4C):   34.2 ml 16.65 ml/m LA Biplane Vol: 52.5 ml 25.56 ml/m   AORTA Ao Root diam: 2.90 cm MITRAL VALVE               TRICUSPID VALVE MV Area (PHT): 2.71 cm    TR Peak grad:   33.4 mmHg MV Decel Time: 280 msec    TR Vmax:        289.00 cm/s MR Peak grad: 112.4 mmHg MR Mean grad: 74.0 mmHg    SHUNTS MR Vmax:      530.00 cm/s  Systemic Diam: 2.00 cm MR Vmean:     409.0 cm/s MV E velocity: 82.70 cm/s MV A velocity: 46.30 cm/s MV E/A ratio:  1.79 Rozann Lesches MD Electronically signed by Rozann Lesches MD Signature Date/Time: 03/03/2021/4:58:46 PM    Final    Disposition   Pt is being discharged home today in improved condition.  Follow-up Plans & Appointments     Follow-up Information    Erma Heritage, PA-C Follow up.   Specialties: Physician Assistant, Cardiology Why: Keep f/u appt on 03/23/2021 Contact information: [place] [id number] [phone]        Satira Sark, MD Follow up.   Specialty: Cardiology Why: See in 3 months or so.  Contact information: [place] [address] [phone]              Discharge Instructions    (HEART FAILURE PATIENTS) Call MD:  Anytime you have any of the following symptoms: 1) 3 pound weight gain in 24 hours or 5 pounds in 1 week 2) shortness of breath, with or without a dry hacking cough 3) swelling in the hands, feet or stomach 4) if you have to sleep on extra pillows at night in order to breathe.   Complete by: As directed    (HEART FAILURE PATIENTS) Call MD:  Anytime you have any of the following symptoms: 1) 3 pound weight gain in 24 hours or 5 pounds in 1 week 2) shortness of breath, with or without a dry hacking cough 3) swelling in the hands, feet or stomach 4) if you have to sleep on extra pillows at night in order to  breathe.   Complete by: As directed    AMB Referral to Cardiac Rehabilitation - Phase II   Complete by: As directed    Diagnosis: Coronary Stents   After initial evaluation and assessments completed: Virtual Based  Care may be provided alone or in conjunction with Phase 2 Cardiac Rehab based on patient barriers.: Yes   Amb Referral to Cardiac Rehabilitation   Complete by: As directed    Diagnosis:  Coronary Stents NSTEMI PTCA     After initial evaluation and assessments completed: Virtual Based Care may be provided alone or in conjunction with Phase 2 Cardiac Rehab based on patient barriers.: Yes   Call MD for:  redness, tenderness, or signs of infection (pain, swelling, redness, odor or green/yellow discharge around incision site)   Complete by: As directed    Call MD for:  redness, tenderness, or signs of infection (pain, swelling, redness, odor or green/yellow discharge around incision site)   Complete by: As directed    Diet - low sodium heart healthy   Complete by: As directed    Increase activity slowly   Complete by: As directed    Increase activity slowly   Complete by: As directed       Discharge Medications   Allergies as of 03/07/2021   No Known Allergies     Medication List    STOP taking these medications   clopidogrel 75 MG tablet Commonly known as: PLAVIX   metoprolol tartrate 25 MG tablet Commonly known as: LOPRESSOR     TAKE these medications   albuterol 108 (90 Base) MCG/ACT inhaler Commonly known as: VENTOLIN HFA Inhale 2 puffs into the lungs every 6 (six) hours as needed for wheezing or shortness of breath.   aspirin 81 MG EC tablet Take 1 tablet (81 mg total) by mouth daily. Swallow whole.   atorvastatin 80 MG tablet Commonly known as: LIPITOR Take 1 tablet (80 mg total) by mouth daily. What changed:   medication strength  how much to take  when to take this   budesonide-formoterol 160-4.5 MCG/ACT inhaler Commonly known as:  SYMBICORT Inhale 2 puffs into the lungs 2 (two) times daily. Notes to patient: When the [facility] runs out begin taking Symbicort   esomeprazole 20 MG capsule Commonly known as: NEXIUM Take 20 mg by mouth daily as needed (heartburn).   losartan 25 MG tablet Commonly known as: Cozaar Take 0.5 tablets (12.5 mg total) by mouth daily. Start taking on: March 08, 2021 Notes to patient: HOLD for today, START on 03/08/2021   metoprolol succinate 25 MG 24 hr tablet Commonly known as: TOPROL-XL Take 1 tablet (25 mg total) by mouth daily.   mometasone-formoterol 200-5 MCG/ACT Aero Commonly known as: DULERA Inhale 2 puffs into the lungs 2 (two) times daily.   nitroGLYCERIN 0.4 MG SL tablet Commonly known as: NITROSTAT Place 1 tablet (0.4 mg total) under the tongue every 5 (five) minutes x 3 doses as needed for chest pain.   spironolactone 25 MG tablet Commonly known as: Aldactone Take 0.5 tablets (12.5 mg total) by mouth daily.   ticagrelor 90 MG Tabs tablet Commonly known as: BRILINTA Take 1 tablet (90 mg total) by mouth 2 (two) times daily.          Outstanding Labs/Studies   Done  Duration of Discharge Encounter   Greater than 30 minutes including physician time.  Signed, Rosaria Ferries, PA-C 03/07/2021, 9:57 AM

## 2021-03-09 ENCOUNTER — Telehealth: Payer: Self-pay | Admitting: *Deleted

## 2021-03-09 LAB — POCT ACTIVATED CLOTTING TIME
Activated Clotting Time: 208 seconds
Activated Clotting Time: 190 seconds
Activated Clotting Time: 172 seconds

## 2021-03-23 ENCOUNTER — Ambulatory Visit: Payer: PRIVATE HEALTH INSURANCE | Admitting: Student

## 2021-03-24 NOTE — Telephone Encounter (Signed)
Discussed with Dr. Herbie Baltimore.     Dr. Herbie Baltimore called and left a message for [facility].  Called and spoke to [place], message received.  Nothing further needed at this time.

## 2021-03-24 NOTE — Telephone Encounter (Signed)
Received call from [facility]-states patient has passed at home and wants to know if Dr. Herbie Baltimore will sign the death certificate.   Advised Dr. Herbie Baltimore is not in office this week.    He continues to ask and needs an answer now.   Advised I am unable to give an answer as I have not discussed with MD.    He states all of his medications have Dr. Elissa Hefty name on them.  I asked if patient had PCP and he said according to everything they see, we are the primary care team.  Advised we are a specialist and typically this is deferred to PCP to sign the death certificate.   He continues to ask to speak to Dr. Herbie Baltimore or his partner.       After discussing this multiple times with him, advised I would try to find out more information and will return call.      Per chart review: patient d/c 3/15. Dr. Herbie Baltimore as rounder.   Patient of Excell Seltzer in 2014, now assigned to Diona Browner (as he consulted on patient in the hospital).   Cath 3/11 by Dr. Clifton James and PCI by Dr. Allyson Sabal 3/14.      Discussed with supervisor who will call to discuss with him.

## 2021-03-24 DEATH — deceased

## 2021-03-30 ENCOUNTER — Other Ambulatory Visit (HOSPITAL_COMMUNITY): Payer: Self-pay
